# Patient Record
Sex: Female | Born: 1965 | Race: White | Hispanic: No | Smoking: Former smoker
Health system: Southern US, Community
[De-identification: ages and names within clinical notes are randomized; demographics above are authoritative.]

## PROBLEM LIST (undated history)

## (undated) DIAGNOSIS — M1990 Unspecified osteoarthritis, unspecified site: Secondary | ICD-10-CM

## (undated) DIAGNOSIS — M51369 Other intervertebral disc degeneration, lumbar region without mention of lumbar back pain or lower extremity pain: Secondary | ICD-10-CM

## (undated) DIAGNOSIS — G629 Polyneuropathy, unspecified: Secondary | ICD-10-CM

## (undated) DIAGNOSIS — F32A Depression, unspecified: Secondary | ICD-10-CM

## (undated) DIAGNOSIS — F419 Anxiety disorder, unspecified: Secondary | ICD-10-CM

## (undated) DIAGNOSIS — M5136 Other intervertebral disc degeneration, lumbar region: Secondary | ICD-10-CM

## (undated) DIAGNOSIS — K219 Gastro-esophageal reflux disease without esophagitis: Secondary | ICD-10-CM

## (undated) DIAGNOSIS — K2100 Gastro-esophageal reflux disease with esophagitis, without bleeding: Secondary | ICD-10-CM

## (undated) DIAGNOSIS — E785 Hyperlipidemia, unspecified: Secondary | ICD-10-CM

## (undated) DIAGNOSIS — T8859XA Other complications of anesthesia, initial encounter: Secondary | ICD-10-CM

## (undated) HISTORY — DX: Hyperlipidemia, unspecified: E78.5

## (undated) HISTORY — DX: Gastro-esophageal reflux disease with esophagitis, without bleeding: K21.00

## (undated) HISTORY — DX: Gastro-esophageal reflux disease with esophagitis: K21.0

## (undated) HISTORY — DX: Polyneuropathy, unspecified: G62.9

## (undated) HISTORY — PX: TRIGGER FINGER RELEASE: SHX641

## (undated) HISTORY — DX: Other intervertebral disc degeneration, lumbar region without mention of lumbar back pain or lower extremity pain: M51.369

## (undated) HISTORY — PX: CARPAL TUNNEL RELEASE: SHX101

## (undated) HISTORY — DX: Other intervertebral disc degeneration, lumbar region: M51.36

## (undated) HISTORY — PX: JOINT REPLACEMENT: SHX530

## (undated) HISTORY — PX: KNEE ARTHROSCOPY: SHX127

## (undated) HISTORY — DX: Gastro-esophageal reflux disease without esophagitis: K21.9

## (undated) HISTORY — DX: Unspecified osteoarthritis, unspecified site: M19.90

## (undated) HISTORY — PX: CHOLECYSTECTOMY: SHX55

---

## 1992-12-07 HISTORY — PX: OTHER SURGICAL HISTORY: SHX169

## 2000-07-28 ENCOUNTER — Encounter: Admission: RE | Admit: 2000-07-28 | Discharge: 2000-07-28 | Payer: Self-pay | Admitting: Family Medicine

## 2000-07-28 ENCOUNTER — Encounter: Payer: Self-pay | Admitting: Family Medicine

## 2006-11-01 ENCOUNTER — Other Ambulatory Visit: Admission: RE | Admit: 2006-11-01 | Discharge: 2006-11-01 | Payer: Self-pay | Admitting: Family Medicine

## 2006-12-10 ENCOUNTER — Encounter: Admission: RE | Admit: 2006-12-10 | Discharge: 2006-12-10 | Payer: Self-pay | Admitting: Family Medicine

## 2007-12-07 ENCOUNTER — Other Ambulatory Visit: Admission: RE | Admit: 2007-12-07 | Discharge: 2007-12-07 | Payer: Self-pay | Admitting: Family Medicine

## 2008-05-18 IMAGING — US MAMMO-RUNI-US
1 series · 6 of 6 positions shown · non-contrast
Comparison: NONE

CLINICAL DATA: c:  Breast Altaj        Oudai Sihamdi, 
RT(R)(M)    Diagnostic Mammogram. 

RIGHT BREAST MAMMOGRAM ADDITIONAL VIEWS AND RIGHT BREAST 
ULTRASOUND

[Series 1: us breast · 0.06mm/px · 6 of 6 slices shown]
[im 1/6]
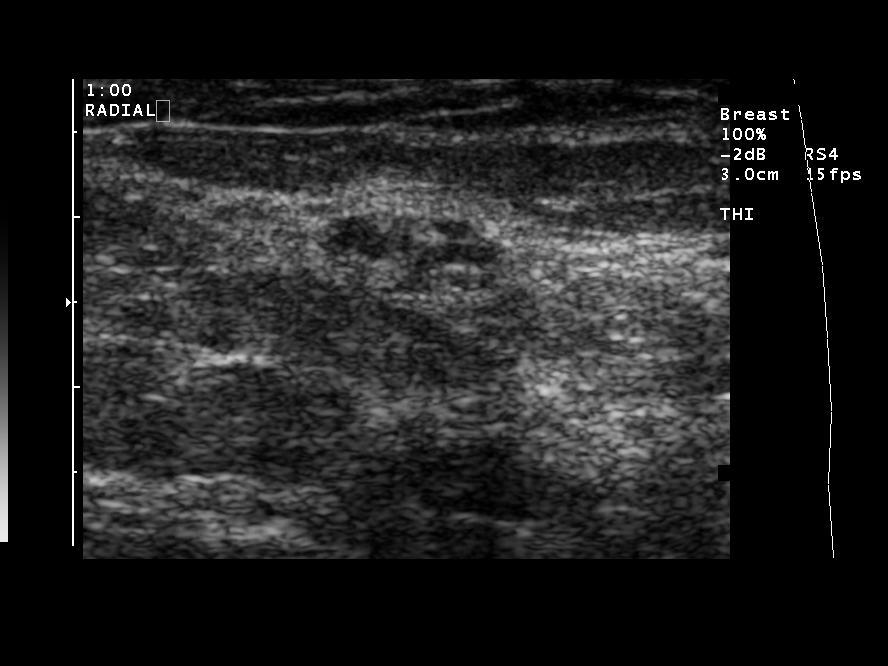
[im 2/6]
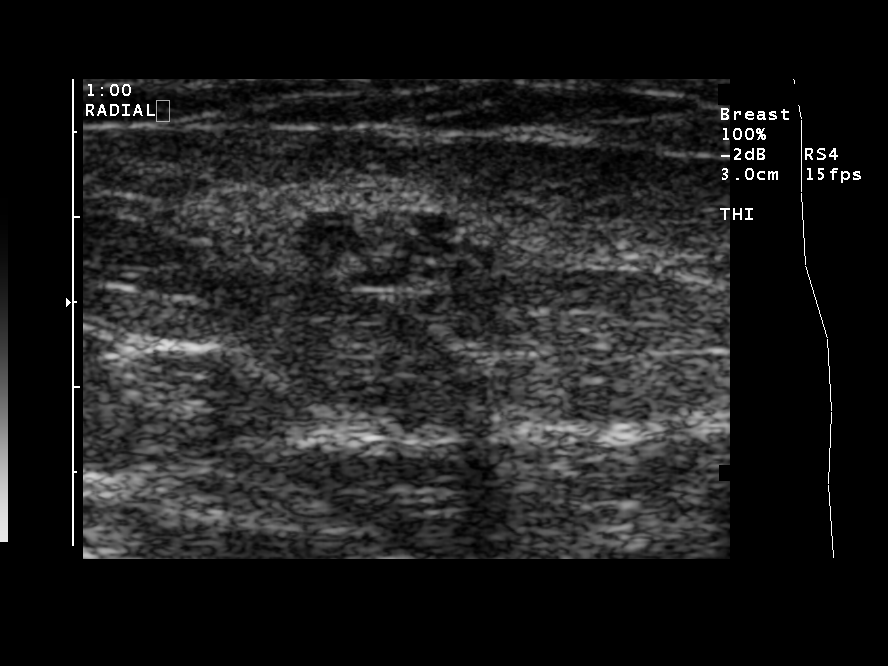
[im 3/6]
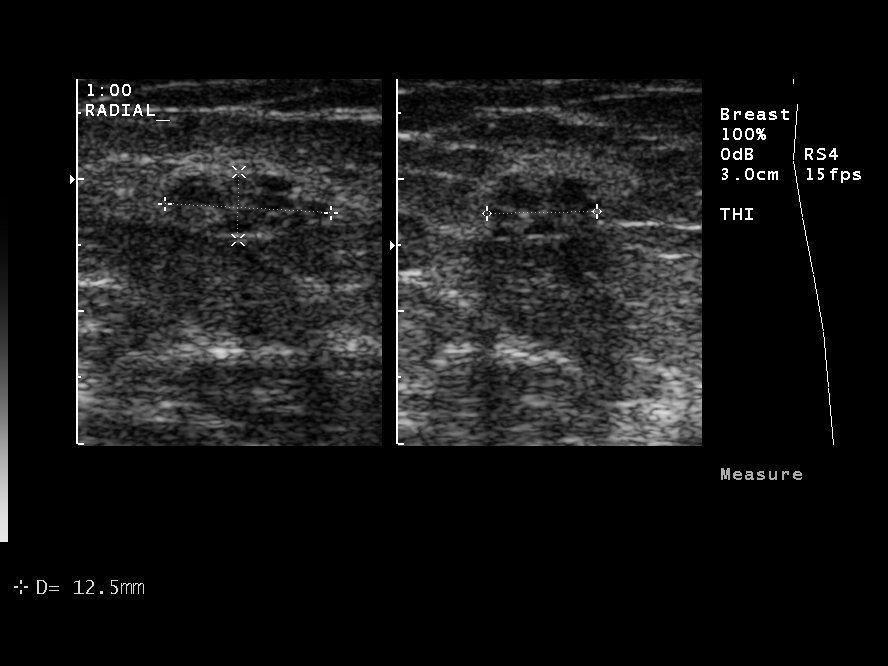
[im 4/6]
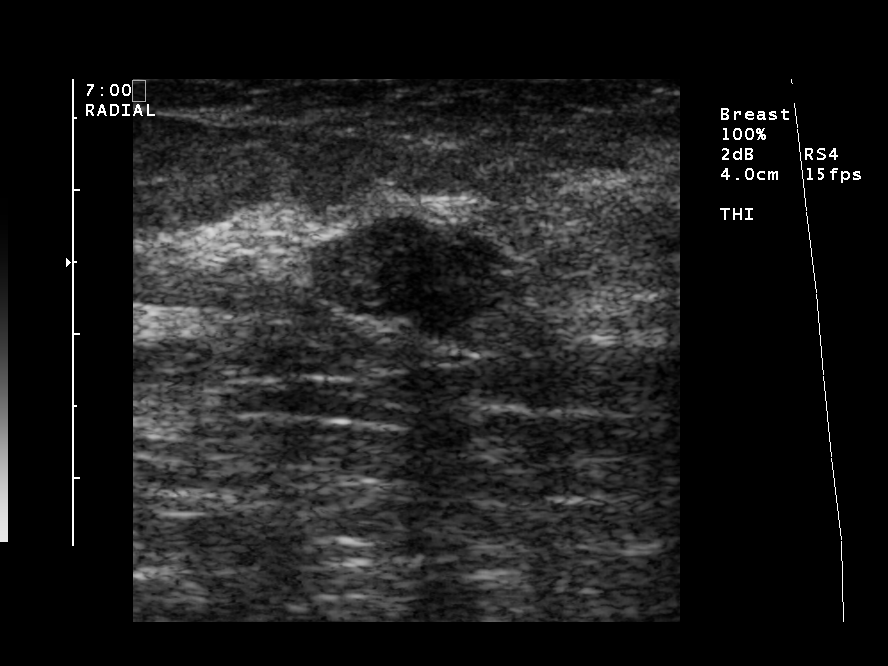
[im 5/6]
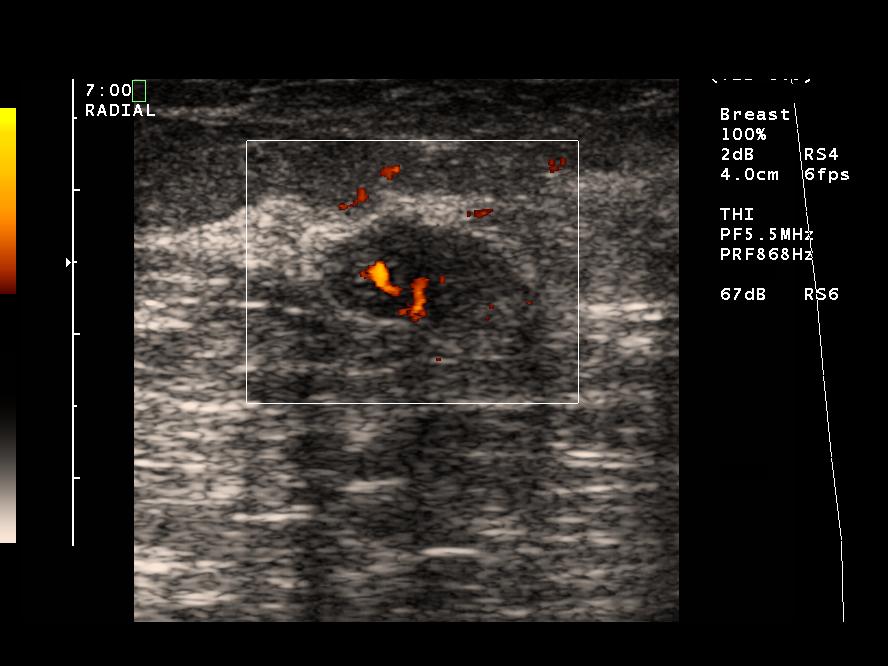
[im 6/6]
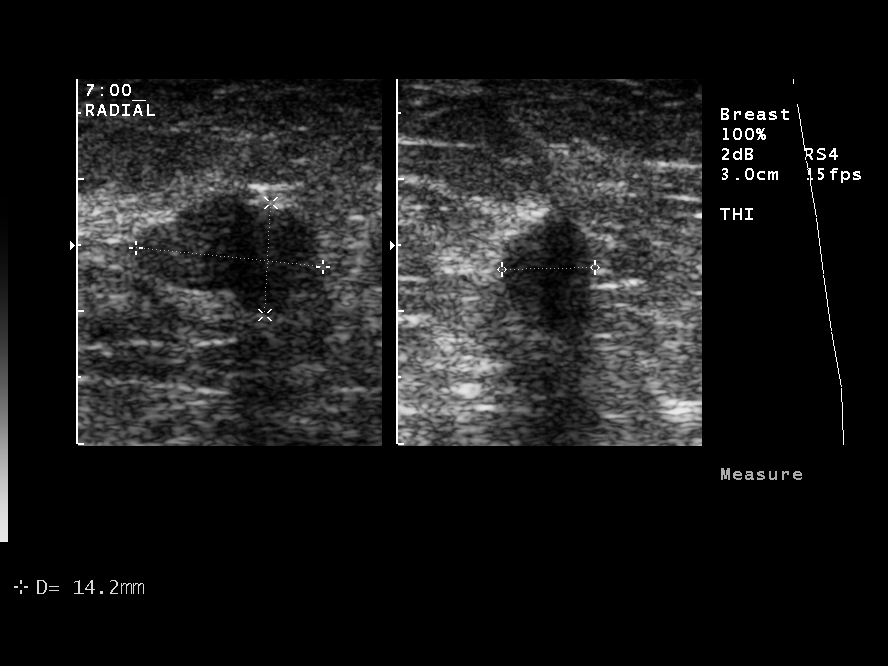

[6 of 6 positions shown; findings below may reference images not displayed]

FINDINGS: Mammogram demonstrates persistent nodular density in 
the right breast in the 6 to [DATE] position.  Smaller nodular 
density, sub-centimeter in size, is noted in the CC view. 
Ultrasound demonstrates an area of minimal microcystic change in 
the [DATE] position. In the [DATE] position, ultrasound demonstrates 
an irregular slightly lobular hypoechoic mass with shadowing which 
likely corresponds to the mammographic abnormality.  This measures 
14.2 x 8.4 x 7.0 mm.
IMPRESSION: Abnormal right breast mammogram and ultrasound with 
findings suspicious for malignancy in the [DATE] position best seen 
on the ultrasound examination.  Referral to the [REDACTED] for 
consideration of biopsy has been scheduled for 12-10-06 at [DATE] 
a.m. The patient was informed at the time of the examination of 
the findings and recommendations by verbal and written lay report. 
Computer assisted (Second Look) technology was used as an aid in 
interpretation of this study. BI-RADS 4:  Suspicious abnormality ??? 
electronically reviewed on 12/08/2006 Dict Date: 12/08/2006  Tran 
Date: 12/08/2006 ABEDRAZAK SA

## 2008-12-12 ENCOUNTER — Other Ambulatory Visit: Admission: RE | Admit: 2008-12-12 | Discharge: 2008-12-12 | Payer: Self-pay | Admitting: Family Medicine

## 2009-04-04 ENCOUNTER — Ambulatory Visit (HOSPITAL_BASED_OUTPATIENT_CLINIC_OR_DEPARTMENT_OTHER): Admission: RE | Admit: 2009-04-04 | Discharge: 2009-04-04 | Payer: Self-pay | Admitting: Orthopedic Surgery

## 2009-12-16 ENCOUNTER — Other Ambulatory Visit: Admission: RE | Admit: 2009-12-16 | Discharge: 2009-12-16 | Payer: Self-pay | Admitting: Family Medicine

## 2010-01-03 ENCOUNTER — Encounter: Admission: RE | Admit: 2010-01-03 | Discharge: 2010-01-03 | Payer: Self-pay | Admitting: Family Medicine

## 2010-12-28 ENCOUNTER — Encounter: Payer: Self-pay | Admitting: Family Medicine

## 2011-03-18 LAB — POCT HEMOGLOBIN-HEMACUE: Hemoglobin: 15 g/dL (ref 12.0–15.0)

## 2011-04-21 NOTE — Op Note (Signed)
NAME:  Robin King, Robin King               ACCOUNT NO.:  [id number]   MEDICAL RECORD NO.:  [id number]          PATIENT TYPE:  AMB   LOCATION:  DSC                          FACILITY:  [facility]   PHYSICIAN:  Katy Fitch. Sypher, M.D. DATE OF BIRTH:  13-Dec-1965   DATE OF PROCEDURE:  04/04/2009  DATE OF DISCHARGE:                               OPERATIVE REPORT   PREOPERATIVE DIAGNOSIS:  Entrapment neuropathy, median nerve, right  carpal tunnel.   POSTOPERATIVE DIAGNOSIS:  Entrapment neuropathy, median nerve, right  carpal tunnel.   OPERATION:  Release of right transcarpal ligament.   OPERATIONS:  Katy Fitch. Sypher, MD.   ASSISTANT:  Marveen Reeks Dasnoit, PA-C   ANESTHESIA:  General by LMA.   SUPERVISING ANESTHESIOLOGIST:  Germaine Pomfret, MD   INDICATIONS:  Kateleen Encarnacion is a 45 year old woman referred through the  courtesy of Dr. Catha Gosselin for evaluation of right hand numbness.  Clinical examination suggested carpal tunnel syndrome.  Electrodiagnostic confirmed severe carpal tunnel syndrome.   Due to a failed respond to nonoperative measures, she is brought to the  operating room at this time for release of her right transcarpal  ligament.   Preoperatively, she was advised of the potential risks and benefits of  surgery.  Questions were invited and answered detail.   PROCEDURE:  Kennedie Pardoe was brought to the operating room and placed  in the supine position on the operating table.   Following the induction of general anesthesia by LMA technique, the  right arm was prepped with Betadine soap solution and sterilely draped.  Following exsanguination of right arm with an Esmarch bandage, an  arterial tourniquet was inflated to 220 mmHg.  Procedure commenced with  short incision in line of the ring finger and palm.  Subcutaneous  tissues were carefully divided revealing palmar fascia.  This was split  longitudinally to reveal the common sensory branch of the median nerve.  These were  followed back to transcarpal ligaments, which was gently  isolated from median nerve with [facility].  The ligament was  released with scissors subcutaneously extending into the distal forearm.  This widely opened carpal canal.  No masses or other predicaments were  noted.  Bleeding points along the margin of the released ligament were  electrocauterized with bipolar current followed by repair of the skin  with intradermal 3-0 Prolene suture.   A compressive dressing was applied with a volar plaster splint  maintaining the wrist in 5 degrees of dorsiflexion.      Katy Fitch Sypher, M.D.  Electronically Signed     RVS/MEDQ  D:  04/04/2009  T:  04/04/2009  Job:  [id number]   cc:   Caryn Bee L. Little, M.D.

## 2017-09-09 DIAGNOSIS — G5603 Carpal tunnel syndrome, bilateral upper limbs: Secondary | ICD-10-CM | POA: Insufficient documentation

## 2017-11-19 ENCOUNTER — Encounter: Payer: Self-pay | Admitting: *Deleted

## 2017-11-22 ENCOUNTER — Encounter: Payer: Self-pay | Admitting: Diagnostic Neuroimaging

## 2017-11-22 ENCOUNTER — Ambulatory Visit: Payer: 59 | Admitting: Diagnostic Neuroimaging

## 2017-11-22 VITALS — BP 147/83 | HR 69 | Ht 66.0 in | Wt 200.8 lb

## 2017-11-22 DIAGNOSIS — R202 Paresthesia of skin: Secondary | ICD-10-CM | POA: Diagnosis not present

## 2017-11-22 DIAGNOSIS — R200 Anesthesia of skin: Secondary | ICD-10-CM

## 2017-11-22 DIAGNOSIS — M5416 Radiculopathy, lumbar region: Secondary | ICD-10-CM

## 2017-11-22 NOTE — Progress Notes (Signed)
[facility]  PATIENT: Robin King DOB: May 23, 1966  REFERRING CLINICIAN: Ramos HISTORY FROM: patient and chart review  REASON FOR VISIT: new consult    HISTORICAL  CHIEF COMPLAINT:  Chief Complaint  Patient presents with  . leg weakness, numbness    rm 7, New Pt, "leg weakness, pain, stinging, electrical shocks-getting worse; ears tingle when I look up or down"    HISTORY OF PRESENT ILLNESS:   51 year old female here for evaluation of lower extremity numbness, pain, electrical sensation.  Also with abnormal sensation in her ears when she moves her head up or down.  Patient reports pain in her hips, buttocks, legs starting about 4 years ago.  Typically this would occur in the summertime when she was more active outside.  Symptoms would be intermittent, worse with activity, less when she would rest.  Over the years this has increased.  This year symptoms have been particularly worse.  She has constant numbness and "dead feeling" in her right anterior and lateral thigh.  Patient went to orthopedic clinic, had evaluation and MRI of the cervical and lumbar spine.  Cervical spine showed multilevel degenerative changes, but more significant in the lumbar spine region.  This is particularly noted at L4-5 level and less so at L2-3 and L3-4 levels.  She was treated empirically with epidural steroid injections on 10/21/17 and 11/13/17.  Unfortunately symptoms have continued.  She is also tried gabapentin 300 g 3 times a day, but cannot tolerate higher doses due to "stuttering".  She is also been started on Cymbalta recently without benefit.  Patient also reports another symptom which is not related to her low back which is an abnormal sensation in her deep inner ears when she moves her head up or down.  She feels like it is a numbness tingling sensation.  Patient has been trying to be more healthy lately.  She quit smoking in March 2018.  She is lost over 40 pounds over the  last 1-2 years by cutting out sugar and bread.  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  She tries to stay active at other times by doing yard work, swimming and other outdoor activities.   REVIEW OF SYSTEMS: Full 14 system review of systems performed and negative with exception of: Numbness weakness slurred speech depression.  ALLERGIES: No Known Allergies  HOME MEDICATIONS: Outpatient Medications Prior to Visit  Medication Sig Dispense Refill  . amphetamine-dextroamphetamine (ADDERALL) 10 MG tablet TAKE 1 TABLET BY MOUTH THREE TIMES A DAY    . buPROPion (WELLBUTRIN XL) 300 MG 24 hr tablet bupropion HCl XL 300 mg 24 hr tablet, extended release    . cetirizine (ZYRTEC) 10 MG chewable tablet Chew 10 mg by mouth daily.    . DULoxetine (CYMBALTA) 20 MG capsule Take 60 mg by mouth daily.    Marland Kitchen. gabapentin (NEURONTIN) 100 MG capsule 300 mg. 1-2 as needed    . meloxicam (MOBIC) 15 MG tablet as needed.    . methocarbamol (ROBAXIN) 750 MG tablet Take 750 mg by mouth every 8 (eight) hours as needed for muscle spasms.    Marland Kitchen. omeprazole (PRILOSEC) 40 MG capsule omeprazole 40 mg capsule,delayed release    . valACYclovir (VALTREX) 500 MG tablet Take 500 mg by mouth daily.     No facility-administered medications prior to visit.     PAST MEDICAL HISTORY: Past Medical History:  Diagnosis Date  . Arthritis   .  DDD (degenerative disc disease), lumbar   . Peripheral neuropathy   . Reflux esophagitis     PAST SURGICAL HISTORY: Past Surgical History:  Procedure Laterality Date  . CARPAL TUNNEL RELEASE Right 2010, 09/2017   both  . CESAREAN SECTION  1993, 1996   x 2  . gall bladder  1994   lap choley  . KNEE ARTHROSCOPY Right 1985, 1987    FAMILY HISTORY: Family History  Problem Relation Age of Onset  . Depression Mother   . Neuropathy Mother   . COPD Father   . Osteoarthritis Father   . Neuropathy Father   . Melanoma  Sister   . Colon cancer Paternal Grandmother     SOCIAL HISTORY:  Social History   Socioeconomic History  . Marital status: Divorced    Spouse name: Not on file  . Number of children: 2  . Years of education: 5416  . Highest education level: Not on file  Social Needs  . Financial resource strain: Not on file  . Food insecurity - worry: Not on file  . Food insecurity - inability: Not on file  . Transportation needs - medical: Not on file  . Transportation needs - non-medical: Not on file  Occupational History    Comment: phlebotomist [employer]  Tobacco Use  . Smoking status: Former Smoker    Packs/day: 3.00    Types: Cigarettes    Last attempt to quit: 09/22/2017    Years since quitting: 0.1  . Smokeless tobacco: Never Used  Substance and Sexual Activity  . Alcohol use: Yes    Comment: 4-6 beers weekly  . Drug use: No  . Sexual activity: Not on file  Other Topics Concern  . Not on file  Social History Narrative   Lives with son   Caffeine- 3 daily, black tea, sodas     PHYSICAL EXAM  GENERAL EXAM/CONSTITUTIONAL: Vitals:  Vitals:   11/22/17 1511  BP: (!) 147/83  Pulse: 69     There is no height or weight on file to calculate BMI.  Visual Acuity Screening   Right eye Left eye Both eyes  Without correction: 20/50 20/50   With correction:        Patient is in no distress; well developed, nourished and groomed; neck is supple  CARDIOVASCULAR:  Examination of carotid arteries is normal; no carotid bruits  Regular rate and rhythm, no murmurs  Examination of peripheral vascular system by observation and palpation is normal  EYES:  Ophthalmoscopic exam of optic discs and posterior segments is normal; no papilledema or hemorrhages  MUSCULOSKELETAL:  Gait, strength, tone, movements noted in Neurologic exam below  NEUROLOGIC: MENTAL STATUS:  No flowsheet data found.  awake, alert, oriented to person, place and time  recent and remote memory  intact  normal attention and concentration  language fluent, comprehension intact, naming intact,   fund of knowledge appropriate  CRANIAL NERVE:   2nd - no papilledema on fundoscopic exam  2nd, 3rd, 4th, 6th - pupils equal and reactive to light, visual fields full to confrontation, extraocular muscles intact, no nystagmus  5th - facial sensation symmetric  7th - facial strength symmetric  8th - hearing intact  9th - palate elevates symmetrically, uvula midline  11th - shoulder shrug symmetric  12th - tongue protrusion midline  MOTOR:   normal bulk and tone, full strength in the BUE, BLE  SENSORY:   normal and symmetric to light touch, temperature, vibration  COORDINATION:   finger-nose-finger,  fine finger movements normal  REFLEXES:   deep tendon reflexes present and symmetric  GAIT/STATION:   narrow based gait; able to walk on toes, heels and tandem; romberg is negative    DIAGNOSTIC DATA (LABS, IMAGING, TESTING) - I reviewed patient records, labs, notes, testing and imaging myself where available.  Lab Results  Component Value Date   HGB 15.0 04/04/2009   No results found for: NA, K, CL, CO2, GLUCOSE, BUN, CREATININE, CALCIUM, PROT, ALBUMIN, AST, ALT, ALKPHOS, BILITOT, GFRNONAA, GFRAA No results found for: CHOL, HDL, LDLCALC, LDLDIRECT, TRIG, CHOLHDL No results found for: WUJW1X No results found for: VITAMINB12 No results found for: TSH   09/27/17 MRI cervical [I reviewed images myself and agree with interpretation. -VRP]  -At C6-7 severe right and moderate left foraminal narrowing; mild to moderate central stenosis -At C5-6 moderate to severe bilateral foraminal narrowing and mild central canal stenosis -At C4-5 mild bilateral foraminal narrowing -At C3-4 mild right foraminal narrowing  09/27/17 MRI lumbar [I reviewed images myself and agree with interpretation. -VRP]  -At L4-5 severe left foraminal narrowing -At L2-3 moderate right foraminal  narrowing -At L3-4 mild right neuroforaminal narrowing; mild spinal canal stenosis     ASSESSMENT AND PLAN  51 y.o. year old female here with abnormal lower extremity numbness, pain, tingling, likely related to lumbar degenerative spine disease and lumbar radiculopathies.  Also with right meralgia paresthetica.  Agree with conservative management of symptoms with anti-inflammatory medications, pain medications, injection therapy, physical therapy, optimize nutrition and exercise.  Regarding her symptoms and her ears and head movement, this is less clear.  I will check MRI of the brain to rule out other causes.   Dx:  1. Lumbar radiculopathy   2. Lower extremity numbness      PLAN:  LOWER EXT NUMBNESS / PAIN (due to lumbar degenerative spine disease and radiculopathy) - continue conservative mgmt (gabapentin, cymbalta, epidural steroid injection, physical therapy) per Dr. Ethelene Hal - optimize nutrition and physical therapy  EAR TINGLING SENSATION - check MRI brain  Orders Placed This Encounter  Procedures  . MR BRAIN WO CONTRAST   Return if symptoms worsen or fail to improve, for return to PCP. pending MRI brain and symptom evolution.    Suanne Marker, MD 11/22/2017, 3:40 PM Certified in Neurology, Neurophysiology and Neuroimaging  [facility] [address] [place], [place] [address] [phone]

## 2017-11-22 NOTE — Patient Instructions (Addendum)
Thank you for coming to see [place] at [facility]. I hope we have been able to provide you high quality care today.  You may receive a patient satisfaction survey over the next few weeks. We would appreciate your feedback and comments so that we may continue to improve ourselves and the health of our patients.  - continue conservative mgmt (gabapentin, cymbalta, epidural steroid injection, physical therapy)  - optimize nutrition and physical therapy   ~~~~~~~~~~~~~~~~~~~~~~~~~~~~~~~~~~~~~~~~~~~~~~~~~~~~~~~~~~~~~~~~~  DR. Bary Limbach'S GUIDE TO HAPPY AND HEALTHY LIVING These are some of my general health and wellness recommendations. Some of them may apply to you better than others. Please use common sense as you try these suggestions and feel free to ask me any questions.   ACTIVITY/FITNESS Mental, social, emotional and physical stimulation are very important for brain and body health. Try learning a new activity (arts, music, language, sports, games).  Keep moving your body to the best of your abilities. You can do this at home, inside or outside, the park, community center, gym or anywhere you like. Consider a physical therapist or personal trainer to get started. Consider the app Sworkit. Fitness trackers such as smart-watches, smart-phones or Fitbits can help as well.   NUTRITION Eat more plants: colorful vegetables, nuts, seeds and berries.  Eat less sugar, salt, preservatives and processed foods.  Avoid toxins such as cigarettes and alcohol.  Drink water when you are thirsty. Warm water with a slice of lemon is an excellent morning drink to start the day.  Consider these websites for more information The Nutrition Source (https://www.henry-hernandez.biz/) Precision Nutrition (WindowBlog.ch)   RELAXATION Consider practicing mindfulness meditation or other relaxation techniques such as deep breathing, prayer, yoga, tai  chi, massage. See website mindful.org or the apps Headspace or Calm to help get started.   SLEEP Try to get at least 7-8+ hours sleep per day. Regular exercise and reduced caffeine will help you sleep better. Practice good sleep hygeine techniques. See website sleep.org for more information.   PLANNING Prepare estate planning, living will, healthcare POA documents. Sometimes this is best planned with the help of an attorney. Theconversationproject.org and agingwithdignity.org are excellent resources.

## 2017-12-04 ENCOUNTER — Ambulatory Visit
Admission: RE | Admit: 2017-12-04 | Discharge: 2017-12-04 | Disposition: A | Discharge status: Home / Self Care | Payer: 59 | Source: Ambulatory Visit | Attending: Diagnostic Neuroimaging | Admitting: Diagnostic Neuroimaging

## 2017-12-04 DIAGNOSIS — R202 Paresthesia of skin: Principal | ICD-10-CM

## 2017-12-04 DIAGNOSIS — R200 Anesthesia of skin: Secondary | ICD-10-CM

## 2017-12-08 ENCOUNTER — Telehealth: Payer: Self-pay | Admitting: *Deleted

## 2017-12-08 NOTE — Telephone Encounter (Signed)
Spoke with patient and informed her that her MRI brain was normal. She asked how to get a copy; advised she needs to sign a release form. She stated she lives one hour away., this RN gave her number to call to sign up for My Chart where she can access report. Patient verbalized understanding,m appreciation.

## 2017-12-18 DIAGNOSIS — G8929 Other chronic pain: Secondary | ICD-10-CM | POA: Insufficient documentation

## 2018-03-08 DIAGNOSIS — M1120 Other chondrocalcinosis, unspecified site: Secondary | ICD-10-CM | POA: Insufficient documentation

## 2019-10-01 ENCOUNTER — Ambulatory Visit
Admission: EM | Admit: 2019-10-01 | Discharge: 2019-10-01 | Disposition: A | Discharge status: Home / Self Care | Payer: Managed Care, Other (non HMO)

## 2019-10-01 ENCOUNTER — Other Ambulatory Visit: Payer: Self-pay

## 2019-10-01 DIAGNOSIS — R059 Cough, unspecified: Secondary | ICD-10-CM

## 2019-10-01 DIAGNOSIS — R05 Cough: Secondary | ICD-10-CM

## 2019-10-01 DIAGNOSIS — Z1159 Encounter for screening for other viral diseases: Secondary | ICD-10-CM | POA: Diagnosis not present

## 2019-10-01 DIAGNOSIS — R110 Nausea: Secondary | ICD-10-CM | POA: Diagnosis not present

## 2019-10-01 MED ORDER — ONDANSETRON 4 MG PO TBDP: 4.0000 mg | ORAL_TABLET | Freq: Three times a day (TID) | ORAL | 0 refills | Status: DC | PRN

## 2019-10-01 MED ORDER — BENZONATATE 100 MG PO CAPS: 100.0000 mg | ORAL_CAPSULE | Freq: Three times a day (TID) | ORAL | 0 refills | Status: DC

## 2019-10-01 NOTE — ED Provider Notes (Signed)
[facility]    CSN: [id number] Arrival date & time: 10/01/19  1334      History   Chief Complaint Chief Complaint  Patient presents with  . Cough    HPI Robin King is a 53 y.o. female presenting for 5-day course of productive, nonwarm to cough, bilateral ear fullness, with less than 24-hour onset of chills, generalized headache, nausea.  Patient has tried leftover benzonatate with adequate relief of cough as well as Mucinex for nasal congestion.  Patient is not taking anything for her headache.  Patient is able to tolerate p.o. intake.  Denies known Covid exposure.   Past Medical History:  Diagnosis Date  . Arthritis   . DDD (degenerative disc disease), lumbar   . Peripheral neuropathy   . Reflux esophagitis     There are no active problems to display for this patient.   Past Surgical History:  Procedure Laterality Date  . CARPAL TUNNEL RELEASE Right 2010, 09/2017   both  . [place]   x 2  . gall bladder  1994   lap choley  . KNEE ARTHROSCOPY Right 1985, 1987    OB History   No obstetric history on file.      Home Medications    Prior to Admission medications   Medication Sig Start Date End Date Taking? Authorizing Provider  celecoxib (CELEBREX) 200 MG capsule Take 200 mg by mouth 2 (two) times daily.   Yes [provider]  amphetamine-dextroamphetamine (ADDERALL) 10 MG tablet TAKE 1 TABLET BY MOUTH THREE TIMES A DAY 09/02/17   [provider]  benzonatate (TESSALON) 100 MG capsule Take 1 capsule (100 mg total) by mouth every 8 (eight) hours. 10/01/19   Hall-Potvin, [place], PA-C  buPROPion (WELLBUTRIN XL) 300 MG 24 hr tablet bupropion HCl XL 300 mg 24 hr tablet, extended release 06/30/17   [provider]  cetirizine (ZYRTEC) 10 MG chewable tablet Chew 10 mg by mouth daily.    [provider]  DULoxetine (CYMBALTA) 20 MG capsule Take 60 mg by mouth daily.    [provider]   gabapentin (NEURONTIN) 100 MG capsule 300 mg. 1-2 as needed    [provider]  meloxicam (MOBIC) 15 MG tablet as needed. 08/22/17   [provider]  methocarbamol (ROBAXIN) 750 MG tablet Take 750 mg by mouth every 8 (eight) hours as needed for muscle spasms.    [provider]  omeprazole (PRILOSEC) 40 MG capsule omeprazole 40 mg capsule,delayed release 09/05/17   [provider]  ondansetron (ZOFRAN ODT) 4 MG disintegrating tablet Take 1 tablet (4 mg total) by mouth every 8 (eight) hours as needed for nausea or vomiting. 10/01/19   Hall-Potvin, [place], PA-C  valACYclovir (VALTREX) 500 MG tablet Take 500 mg by mouth daily.    [provider]    Family History Family History  Problem Relation Age of Onset  . Depression Mother   . Neuropathy Mother   . COPD Father   . Osteoarthritis Father   . Neuropathy Father   . Melanoma Sister   . Colon cancer Paternal Grandmother     Social History Social History   Tobacco Use  . Smoking status: Former Smoker    Packs/day: 3.00    Types: Cigarettes    Quit date: 09/22/2017    Years since quitting: 2.0  . Smokeless tobacco: Never Used  Substance Use Topics  . Alcohol use: Yes    Comment: 4-6 beers  weekly  . Drug use: No     Allergies   Patient has no known allergies.   Review of Systems Review of Systems  Constitutional: Positive for appetite change and chills. Negative for activity change, fatigue and fever.  HENT: Positive for ear pain. Negative for congestion, dental problem, facial swelling, hearing loss, sinus pain, sore throat, trouble swallowing and voice change.   Eyes: Negative for photophobia, pain and visual disturbance.  Respiratory: Positive for cough. Negative for chest tightness, shortness of breath and wheezing.   Cardiovascular: Negative for chest pain and palpitations.  Gastrointestinal: Positive for nausea. Negative for abdominal pain, blood in stool, diarrhea and  vomiting.  Musculoskeletal: Negative for arthralgias and myalgias.  Neurological: Negative for dizziness and headaches.     Physical Exam Triage Vital Signs ED Triage Vitals  Enc Vitals Group     BP 10/01/19 1357 (!) 158/93     Pulse Rate 10/01/19 1357 87     Resp --      Temp 10/01/19 1357 98.6 F (37 C)     Temp Source 10/01/19 1357 Oral     SpO2 10/01/19 1357 97 %     Weight 10/01/19 1354 224 lb (101.6 kg)     Height --      Head Circumference --      Peak Flow --      Pain Score 10/01/19 1353 5     Pain Loc --      Pain Edu? --      Excl. in GC? --    No data found.  Updated Vital Signs BP (!) 158/93 (BP Location: Left Arm)   Pulse 87   Temp 98.6 F (37 C) (Oral)   Wt 224 lb (101.6 kg)   SpO2 97%   BMI 36.15 kg/m   Visual Acuity Right Eye Distance:   Left Eye Distance:   Bilateral Distance:    Right Eye Near:   Left Eye Near:    Bilateral Near:     Physical Exam Constitutional:      General: She is not in acute distress.    Appearance: She is not toxic-appearing.  HENT:     Head: Normocephalic and atraumatic.     Jaw: There is normal jaw occlusion. No tenderness or pain on movement.     Right Ear: Hearing, tympanic membrane, ear canal and external ear normal. No tenderness. No mastoid tenderness.     Left Ear: Hearing, tympanic membrane, ear canal and external ear normal. No tenderness. No mastoid tenderness.     Nose: Nose normal. No nasal deformity, septal deviation or nasal tenderness.     Right Turbinates: Not swollen or pale.     Left Turbinates: Not swollen or pale.     Right Sinus: No maxillary sinus tenderness or frontal sinus tenderness.     Left Sinus: No maxillary sinus tenderness or frontal sinus tenderness.     Mouth/Throat:     Lips: Pink. No lesions.     Mouth: Mucous membranes are moist. No injury.     Pharynx: Oropharynx is clear. Uvula midline. No posterior oropharyngeal erythema or uvula swelling.     Comments: no tonsillar  exudate or hypertrophy Eyes:     General: No scleral icterus.    Conjunctiva/sclera: Conjunctivae normal.     Pupils: Pupils are equal, round, and reactive to light.  Neck:     Musculoskeletal: Normal range of motion and neck supple. No muscular tenderness.  Cardiovascular:  Rate and Rhythm: Normal rate and regular rhythm.     Heart sounds: No murmur.  Pulmonary:     Effort: Pulmonary effort is normal. No respiratory distress.     Breath sounds: No wheezing.  Abdominal:     General: Abdomen is flat. Bowel sounds are normal.     Tenderness: There is no abdominal tenderness.  Lymphadenopathy:     Cervical: No cervical adenopathy.  Neurological:     Mental Status: She is alert and oriented to person, place, and time.      UC Treatments / Results  Labs (all labs ordered are listed, but only abnormal results are displayed) Labs Reviewed  NOVEL CORONAVIRUS, NAA    EKG   Radiology No results found.  Procedures Procedures (including critical care time)  Medications Ordered in UC Medications - No data to display  Initial Impression / Assessment and Plan / UC Course  I have reviewed the triage vital signs and the nursing notes.  Pertinent labs & imaging results that were available during my care of the patient were reviewed by me and considered in my medical decision making (see chart for details).     Patient afebrile, nontoxic in office today.  Patient does report ability to tolerate p.o. intake, though does admit to discomfort second to nausea.  Will trial Zofran for additional relief so that patient may maintain adequate p.o. hydration/nutrition.  Covid testing obtained in office given global pandemic, line of work-patient tolerated well and will quarantine until results are back.  Patient had run out of her Jerilynn Somessalon Perles (had only taken 4 doses total): Refill provided.  Patient denies shortness of breath: Albuterol inhaler deferred.  Will monitor symptoms closely and  consider reevaluation (VV vs in-person +/- CXR)  if testing is negative, cough is persisting/worsening or she develops fever.  Return precautions discussed, patient verbalized understanding and is agreeable to plan. Final Clinical Impressions(s) / UC Diagnoses   Final diagnoses:  Cough  Nausea without vomiting     Discharge Instructions     Your COVID test is pending: Is important you quarantine at home until your results are back. You may take OTC Tylenol for fever and myalgias.  It is important to drink plenty of water throughout the day to stay hydrated. If you test positive and later develop severe fever, cough, or shortness of breath, it is recommended that you go to the ER for further evaluation.    ED Prescriptions    Medication Sig Dispense Auth. Provider   ondansetron (ZOFRAN ODT) 4 MG disintegrating tablet Take 1 tablet (4 mg total) by mouth every 8 (eight) hours as needed for nausea or vomiting. 21 tablet Hall-Potvin, [place], PA-C   benzonatate (TESSALON) 100 MG capsule Take 1 capsule (100 mg total) by mouth every 8 (eight) hours. 21 capsule Hall-Potvin, [place], PA-C     PDMP not reviewed this encounter.   Hall-Potvin, [place], [place]C 10/01/19 1815

## 2019-10-01 NOTE — Discharge Instructions (Signed)
Your COVID test is pending: Is important you quarantine at home until your results are back. °You may take OTC Tylenol for fever and myalgias.  It is important to drink plenty of water throughout the day to stay hydrated. °If you test positive and later develop severe fever, cough, or shortness of breath, it is recommended that you go to the ER for further evaluation. °

## 2019-10-01 NOTE — ED Notes (Signed)
Patient able to ambulate independently  

## 2019-10-01 NOTE — ED Triage Notes (Signed)
Pt. States she has had a severe cough with green mucus since wednesday. Earache, body chills, pressure in ears since friday.

## 2019-10-02 LAB — NOVEL CORONAVIRUS, NAA: SARS-CoV-2, NAA: NOT DETECTED

## 2019-10-05 ENCOUNTER — Telehealth: Payer: Managed Care, Other (non HMO) | Admitting: Physician Assistant

## 2019-10-05 DIAGNOSIS — Z20822 Contact with and (suspected) exposure to covid-19: Secondary | ICD-10-CM

## 2019-10-05 DIAGNOSIS — R0989 Other specified symptoms and signs involving the circulatory and respiratory systems: Secondary | ICD-10-CM

## 2019-10-05 NOTE — Progress Notes (Signed)
Based on what you shared with me, I feel your condition warrants further evaluation and I recommend that you be seen for a face to face office visit.  Based on what you were telling me including previous evaluation with some concerning lung sounds and no improvement/worsening of symptoms, you need reassessment in person. This is to reassess your oxygenation status, reassess lung exam with potential imaging. covid does cause viral pneumonia plus you can develop secondary bacterial bronchitis and pneumonia while fighting viral infections.   If you are having a true medical emergency please call 911.     For an urgent face to face visit, [facility] has four urgent care centers for your convenience:   . [facility]    [phone]                  Get Driving Directions  [address], [place] [address] . 10 am to 8 pm Monday-Friday . 12 pm to 8 pm Saturday-Sunday   . [facility] [place]                  Get Driving Directions  [address], [place], [place] [address] . 8 am to 8 pm Monday-Friday . 9 am to 6 pm Saturday . 11 am to 6 pm Sunday   . [facility] [place]                  Get Driving Directions   [address] [place], [place] [address] . 8 am to 8 pm Monday-Friday . 8 am to 4 pm Saturday-Sunday    . [facility] [place]                    Get Driving Directions  [phone]  [address], [place], [place] [address]  . Monday-Friday, 12 PM to 6 PM    Your e-visit answers were reviewed by a board certified advanced clinical practitioner to complete your personal care plan.  Thank you for using e-Visits.

## 2019-10-12 ENCOUNTER — Ambulatory Visit (INDEPENDENT_AMBULATORY_CARE_PROVIDER_SITE_OTHER): Payer: Managed Care, Other (non HMO) | Admitting: Family Medicine

## 2019-10-12 ENCOUNTER — Other Ambulatory Visit: Payer: Self-pay

## 2019-10-12 ENCOUNTER — Encounter: Payer: Self-pay | Admitting: Family Medicine

## 2019-10-12 DIAGNOSIS — Z23 Encounter for immunization: Secondary | ICD-10-CM | POA: Diagnosis not present

## 2019-10-12 DIAGNOSIS — J218 Acute bronchiolitis due to other specified organisms: Secondary | ICD-10-CM | POA: Diagnosis not present

## 2019-10-12 MED ORDER — AZITHROMYCIN 250 MG PO TABS: 250.0000 mg | ORAL_TABLET | Freq: Every day | ORAL | 0 refills | Status: AC

## 2019-10-12 NOTE — Progress Notes (Signed)
   Subjective:    Patient ID: Robin King is a 53 y.o. female presenting with No chief complaint on file.  on 10/12/2019  HPI: 2 wk h/o cough. No fever/chills. Has tried tessalon, albuterol. Notes visit to Urgent care with negative COVID test 2 wks ago. She has not a flu shot.  Review of Systems  Constitutional: Negative for chills and fever.  Respiratory: Negative for shortness of breath.   Cardiovascular: Negative for chest pain.  Gastrointestinal: Negative for abdominal pain, nausea and vomiting.  Genitourinary: Negative for dysuria.  Skin: Negative for rash.      Objective:    There were no vitals taken for this visit. Physical Exam Constitutional:      General: She is not in acute distress.    Appearance: She is well-developed.  HENT:     Head: Normocephalic and atraumatic.  Eyes:     General: No scleral icterus. Neck:     Musculoskeletal: Neck supple.  Cardiovascular:     Rate and Rhythm: Normal rate.  Pulmonary:     Effort: Pulmonary effort is normal.     Breath sounds: Normal breath sounds. No wheezing, rhonchi or rales.  Abdominal:     Palpations: Abdomen is soft.  Skin:    General: Skin is warm and dry.  Neurological:     Mental Status: She is alert and oriented to person, place, and time.         Assessment & Plan:   Problem List Items Addressed This Visit    None    Visit Diagnoses    Acute bronchiolitis due to other specified organisms    -  Primary   Given no improvement x 2 wks, will treat presumptively.   Relevant Medications   azithromycin (ZITHROMAX) 250 MG tablet      Total face-to-face time with patient: 15 minutes. Over 50% of encounter was spent on counseling and coordination of care. Return if symptoms worsen or fail to improve.  Donnamae Jude 10/12/2019 3:09 PM

## 2019-10-15 ENCOUNTER — Encounter: Payer: Self-pay | Admitting: Emergency Medicine

## 2019-10-15 ENCOUNTER — Ambulatory Visit (INDEPENDENT_AMBULATORY_CARE_PROVIDER_SITE_OTHER): Payer: Managed Care, Other (non HMO)

## 2019-10-15 ENCOUNTER — Ambulatory Visit
Admission: EM | Admit: 2019-10-15 | Discharge: 2019-10-15 | Disposition: A | Discharge status: Home / Self Care | Payer: Managed Care, Other (non HMO) | Attending: Emergency Medicine | Admitting: Emergency Medicine

## 2019-10-15 ENCOUNTER — Other Ambulatory Visit: Payer: Self-pay

## 2019-10-15 DIAGNOSIS — Z20828 Contact with and (suspected) exposure to other viral communicable diseases: Secondary | ICD-10-CM | POA: Diagnosis not present

## 2019-10-15 DIAGNOSIS — J45998 Other asthma: Secondary | ICD-10-CM | POA: Diagnosis not present

## 2019-10-15 DIAGNOSIS — R05 Cough: Secondary | ICD-10-CM

## 2019-10-15 DIAGNOSIS — R059 Cough, unspecified: Secondary | ICD-10-CM

## 2019-10-15 MED ORDER — PREDNISONE 20 MG PO TABS: 40.0000 mg | ORAL_TABLET | Freq: Every day | ORAL | 0 refills | Status: AC

## 2019-10-15 NOTE — Discharge Instructions (Signed)
Take steroid as prescribed. May continue albuterol as needed. May take Mucinex DM for additional relief.  Your COVID test is pending - it is important to quarantine / isolate at home until your results are back. If you test positive and would like further evaluation for persistent or worsening symptoms, you may schedule an E-visit or virtual (video) visit throughout the [facility] app or website.  PLEASE NOTE: If you develop severe chest pain or shortness of breath please go to the ER or call 9-1-1 for further evaluation --> DO NOT schedule electronic or virtual visits for this. Please call our office for further guidance / recommendations as needed.

## 2019-10-15 NOTE — ED Triage Notes (Addendum)
PT has had cough for 20 days. Was seen for this 2 weeks ago. PT was COVID negative at that visit.   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   PT has been incontinent with cough, it keeps her up at night. She works in an [employer] office and is requesting COVID testing again.  Requests chest xray as well.   Also has headache

## 2019-10-15 NOTE — ED Provider Notes (Addendum)
[facility]    CSN: [id number] Arrival date & time: 10/15/19  1150      History   Chief Complaint Chief Complaint  Patient presents with  . Cough    HPI Robin King is a 53 y.o. female presenting for persistent cough.  Patient seen initially in urgent care setting on 09/30/2024 me: Given Zofran for nausea, Tessalon Perles for cough.  Patient denied nausea now, had some relief with benzonatate.  Overall, patient feels symptoms have been persistent/stable.  Since last visit in UC, was evaluated by her PCP via E-visit who recommended in person evaluation.  Patient did not want to risk Covid exposure by coming to UC, so her OB/GYN wrote for Z-Pak for acute bronchiolitis.  Patient is on day 4 and reports no improvement of symptoms.  Patient feels cough is productive, without hemoptysis, in the morning and evening: Denies nasal congestion, sinus pressure, postnasal drip.  Patient denies history of heart disease, cardiomyopathy, heart failure.  No upper or lower extremity edema.   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  Covid test from 10/01/2019 was negative.  Patient largely concerned about being contagious as she is to take care of her grandson who is 108 months old next weekend.    Past Medical History:  Diagnosis Date  . Arthritis   . DDD (degenerative disc disease), lumbar   . Peripheral neuropathy   . Reflux esophagitis     There are no active problems to display for this patient.   Past Surgical History:  Procedure Laterality Date  . CARPAL TUNNEL RELEASE Right 2010, 09/2017   both  . CESAREAN SECTION  1993, 1996   x 2  . gall bladder  1994   lap choley  . KNEE ARTHROSCOPY Right 1985, 1987    OB History   No obstetric history on file.      Home Medications    Prior to Admission medications   Medication Sig Start Date End Date Taking? Authorizing Provider  amphetamine-dextroamphetamine  (ADDERALL) 10 MG tablet TAKE 1 TABLET BY MOUTH THREE TIMES A DAY 09/02/17  Yes [provider]  azithromycin (ZITHROMAX) 250 MG tablet Take 1 tablet (250 mg total) by mouth daily for 5 days. 2 po day 1 then 1 po q day x 4 10/12/19 10/17/19 Yes Reva Bores, MD  benzonatate (TESSALON) 100 MG capsule Take 1 capsule (100 mg total) by mouth every 8 (eight) hours. 10/01/19  Yes Hall-Potvin, [place], PA-C  buPROPion (WELLBUTRIN XL) 300 MG 24 hr tablet bupropion HCl XL 300 mg 24 hr tablet, extended release 06/30/17  Yes [provider]  celecoxib (CELEBREX) 200 MG capsule Take 200 mg by mouth 2 (two) times daily.   Yes [provider]  gabapentin (NEURONTIN) 100 MG capsule 300 mg. 1-2 as needed   Yes [provider]  methocarbamol (ROBAXIN) 750 MG tablet Take 750 mg by mouth every 8 (eight) hours as needed for muscle spasms.   Yes [provider]  omeprazole (PRILOSEC) 40 MG capsule omeprazole 40 mg capsule,delayed release 09/05/17  Yes [provider]  ondansetron (ZOFRAN ODT) 4 MG disintegrating tablet Take 1 tablet (4 mg total) by mouth every 8 (eight) hours as needed for nausea or vomiting. 10/01/19  Yes Hall-Potvin, [place], PA-C  valACYclovir (VALTREX) 500 MG tablet Take 500 mg by mouth daily.   Yes [provider]  cetirizine (ZYRTEC) 10 MG chewable tablet Chew  10 mg by mouth daily.    [provider]  DULoxetine (CYMBALTA) 20 MG capsule Take 60 mg by mouth daily.    [provider]  meloxicam (MOBIC) 15 MG tablet as needed. 08/22/17   [provider]  predniSONE (DELTASONE) 20 MG tablet Take 2 tablets (40 mg total) by mouth daily for 5 days. 10/15/19 10/20/19  Hall-Potvin, [place], PA-C    Family History Family History  Problem Relation Age of Onset  . Depression Mother   . Neuropathy Mother   . COPD Father   . Osteoarthritis Father   . Neuropathy Father   . Melanoma Sister   . Colon cancer Paternal  Grandmother     Social History Social History   Tobacco Use  . Smoking status: Former Smoker    Packs/day: 1.00    Types: Cigarettes    Quit date: 09/22/2017    Years since quitting: 2.0  . Smokeless tobacco: Never Used  Substance Use Topics  . Alcohol use: Yes    Comment: 4-6 beers weekly  . Drug use: No     Allergies   Patient has no known allergies.   Review of Systems Review of Systems  Constitutional: Negative for activity change, appetite change, fatigue and fever.  HENT: Negative for congestion, dental problem, ear pain, facial swelling, hearing loss, sinus pain, sore throat, trouble swallowing and voice change.   Eyes: Negative for photophobia, pain, redness and visual disturbance.  Respiratory: Positive for cough. Negative for shortness of breath and wheezing.   Cardiovascular: Negative for chest pain, palpitations and leg swelling.  Gastrointestinal: Negative for abdominal pain, diarrhea and vomiting.  Musculoskeletal: Negative for arthralgias and myalgias.  Skin: Negative for rash and wound.  Neurological: Negative for dizziness, syncope and headaches.     Physical Exam Triage Vital Signs ED Triage Vitals [10/15/19 1206]  Enc Vitals Group     BP (!) 144/96     Pulse Rate 84     Resp 16     Temp 98.5 F (36.9 C)     Temp Source Oral     SpO2 96 %     Weight      Height      Head Circumference      Peak Flow      Pain Score 4     Pain Loc      Pain Edu?      Excl. in GC?    No data found.  Updated Vital Signs BP (!) 144/96   Pulse 84   Temp 98.5 F (36.9 C) (Oral)   Resp 16   SpO2 96%   Visual Acuity Right Eye Distance:   Left Eye Distance:   Bilateral Distance:    Right Eye Near:   Left Eye Near:    Bilateral Near:     Physical Exam Constitutional:      General: She is not in acute distress.    Appearance: She is obese. She is not ill-appearing.  HENT:     Head: Normocephalic and atraumatic.     Mouth/Throat:     Mouth:  Mucous membranes are moist.     Pharynx: Oropharynx is clear.  Eyes:     General: No scleral icterus.    Pupils: Pupils are equal, round, and reactive to light.  Neck:     Musculoskeletal: Neck supple. No muscular tenderness.     Comments: Trachea midline, negative JVD Cardiovascular:     Rate and Rhythm: Normal rate and  regular rhythm.     Heart sounds: No murmur. No gallop.   Pulmonary:     Effort: Pulmonary effort is normal. No respiratory distress.     Breath sounds: No wheezing, rhonchi or rales.     Comments: Good air entry bilaterally without prolonged expiratory phase Musculoskeletal:     Right lower leg: No edema.     Left lower leg: No edema.  Lymphadenopathy:     Cervical: No cervical adenopathy.  Skin:    Capillary Refill: Capillary refill takes less than 2 seconds.     Coloration: Skin is not jaundiced or pale.     Findings: No rash.     Comments: Negative for mottling  Neurological:     General: No focal deficit present.     Mental Status: She is alert and oriented to person, place, and time.      UC Treatments / Results  Labs (all labs ordered are listed, but only abnormal results are displayed) Labs Reviewed  NOVEL CORONAVIRUS, NAA    EKG   Radiology Dg Chest 2 View  Result Date: 10/15/2019 CLINICAL DATA:  Productive cough. EXAM: CHEST - 2 VIEW COMPARISON:  None. FINDINGS: Cardiac contours upper limits of normal. Minimal bibasilar heterogeneous opacities. No pleural effusion or pneumothorax. Thoracic spine degenerative changes. IMPRESSION: Bibasilar heterogeneous opacities favored represent atelectasis. Infection not excluded. Electronically Signed   By: Lovey Newcomer M.D.   On: 10/15/2019 13:15    Procedures Procedures (including critical care time)  Medications Ordered in UC Medications - No data to display  Initial Impression / Assessment and Plan / UC Course  I have reviewed the triage vital signs and the nursing notes.  Pertinent labs &  imaging results that were available during my care of the patient were reviewed by me and considered in my medical decision making (see chart for details).     CXR obtained in office, reviewed by me and radiology: Bibasilar heterogenous opacities favoring atelectasis, though does not exclude infection.  Reviewed case with Dr. Meda Coffee who agrees with A/P: Likely post viral sequela-will trial steroid in conjunction with Mucinex DM, repeat Covid testing, have patient follow-up with PCP/pulmonology if persistent, worsening symptoms thereafter.  Sitting in no acute distress.  Patient is able to speak in full sentences without coughing, sneezing, wheezing. Final Clinical Impressions(s) / UC Diagnoses   Final diagnoses:  Cough  Post-viral reactive airway disease     Discharge Instructions     Take steroid as prescribed. May continue albuterol as needed. May take Mucinex DM for additional relief.  Your COVID test is pending - it is important to quarantine / isolate at home until your results are back. If you test positive and would like further evaluation for persistent or worsening symptoms, you may schedule an E-visit or virtual (video) visit throughout the [facility] app or website.  PLEASE NOTE: If you develop severe chest pain or shortness of breath please go to the ER or call 9-1-1 for further evaluation --> DO NOT schedule electronic or virtual visits for this. Please call our office for further guidance / recommendations as needed.    ED Prescriptions    Medication Sig Dispense Auth. Provider   predniSONE (DELTASONE) 20 MG tablet Take 2 tablets (40 mg total) by mouth daily for 5 days. 10 tablet Hall-Potvin, [place], PA-C     PDMP not reviewed this encounter.   Hall-Potvin, [place], PA-C 10/15/19 [place], [place], [place] 10/15/19 1746

## 2019-10-16 LAB — NOVEL CORONAVIRUS, NAA: SARS-CoV-2, NAA: NOT DETECTED

## 2020-05-15 IMAGING — DX DG CHEST 2V
2 series · 2 of 2 positions shown · non-contrast
Comparison: None.

CLINICAL DATA: Productive cough.

EXAM:
CHEST - 2 VIEW

[chest pa]
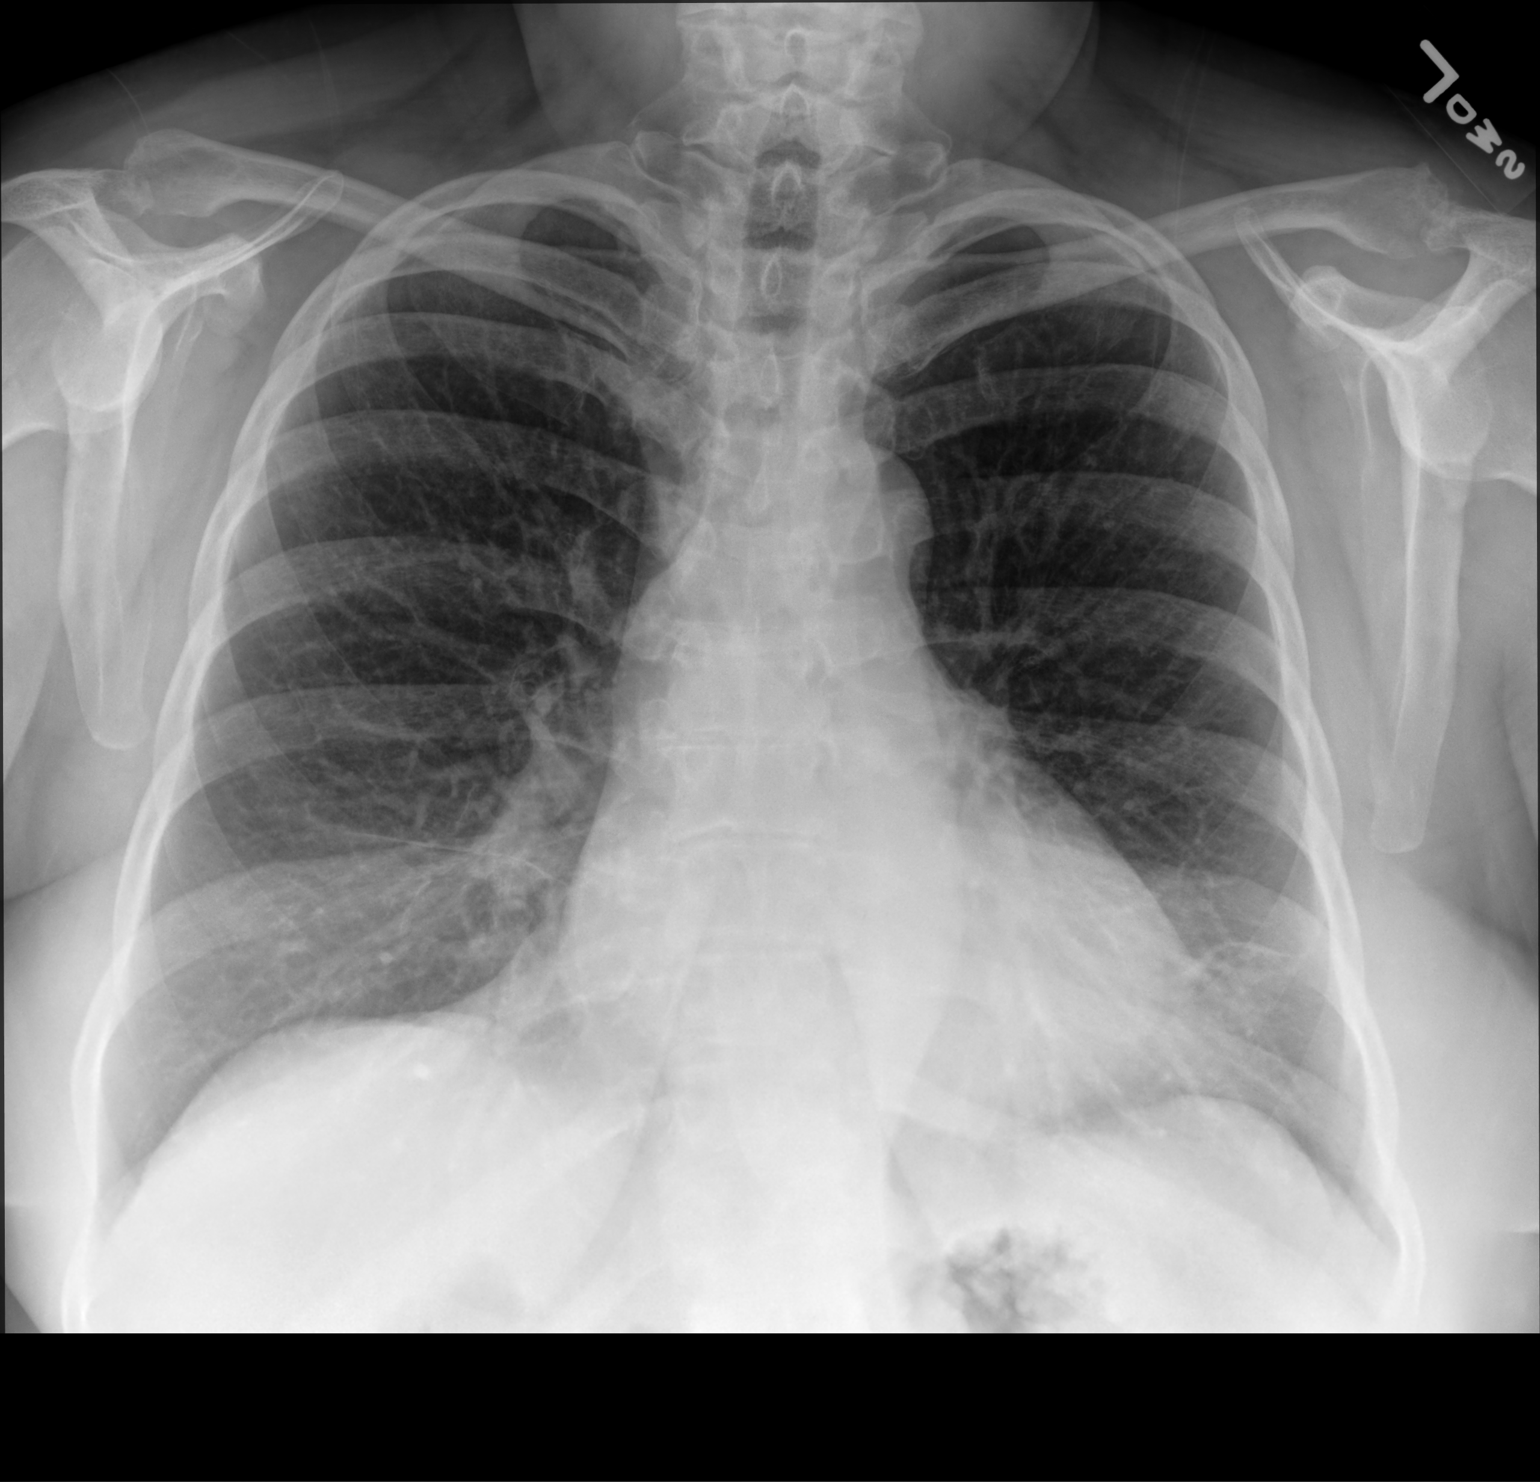

[chest lat]
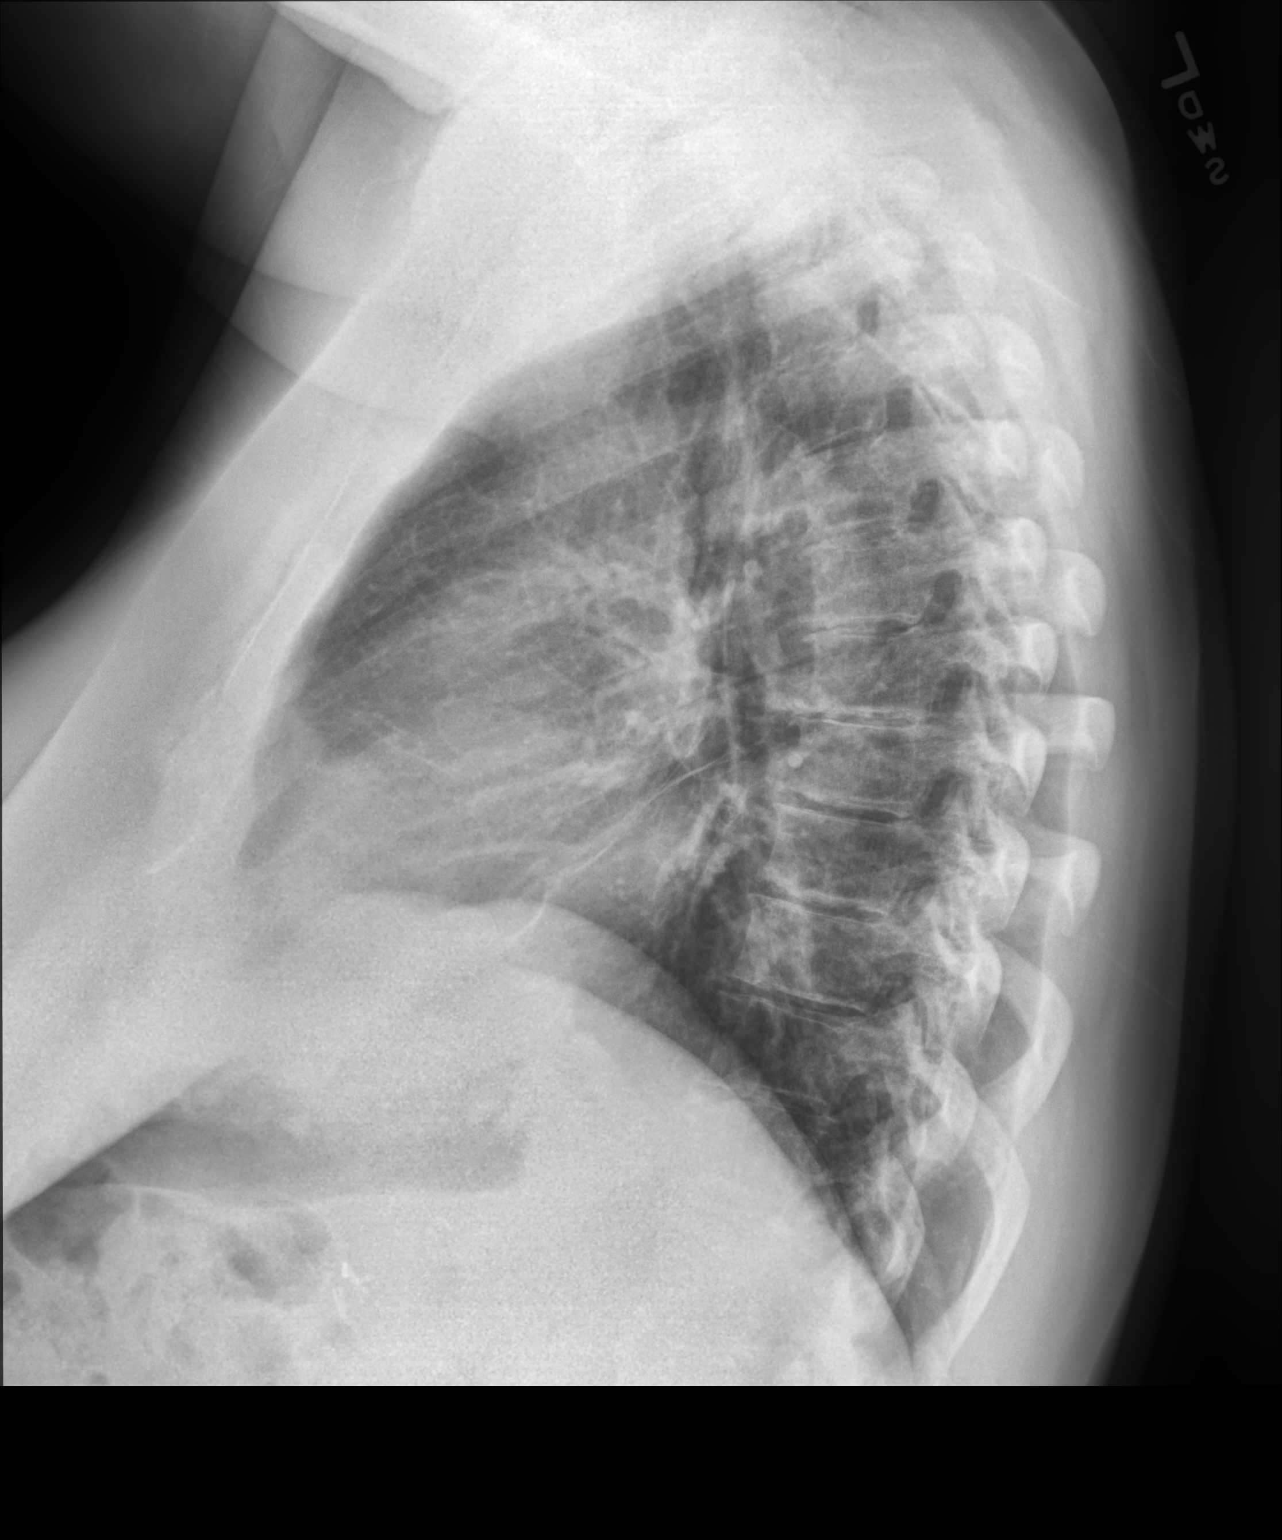

[2 of 2 positions shown; findings below may reference images not displayed]

FINDINGS: Cardiac contours upper limits of normal. Minimal bibasilar
heterogeneous opacities. No pleural effusion or pneumothorax.
Thoracic spine degenerative changes.
IMPRESSION: Bibasilar heterogeneous opacities favored represent atelectasis.
Infection not excluded.

## 2020-07-29 ENCOUNTER — Other Ambulatory Visit: Payer: Self-pay

## 2020-07-29 ENCOUNTER — Other Ambulatory Visit: Payer: Managed Care, Other (non HMO)

## 2020-07-29 DIAGNOSIS — Z0000 Encounter for general adult medical examination without abnormal findings: Secondary | ICD-10-CM

## 2020-07-29 LAB — HEMOGLOBIN A1C
Est. average glucose Bld gHb Est-mCnc: 120 mg/dL
Hgb A1c MFr Bld: 5.8 % — ABNORMAL HIGH (ref 4.8–5.6)

## 2020-07-30 LAB — SARS-COV-2 SEMI-QUANTITATIVE TOTAL ANTIBODY, SPIKE
SARS-CoV-2 Semi-Quant Total Ab: <0.4 U/mL (ref <0.8)
SARS-CoV-2 Spike Ab Interp: NEGATIVE

## 2020-07-30 LAB — CREATININE, SERUM
Creatinine, Ser: 0.88 mg/dL (ref 0.57–1.00)
GFR calc Af Amer: 87 mL/min/{1.73_m2} (ref >59)
GFR calc non Af Amer: 76 mL/min/{1.73_m2} (ref >59)

## 2020-07-30 LAB — LIPID PANEL
Chol/HDL Ratio: 3.6 ratio (ref 0.0–4.4)
Cholesterol, Total: 207 mg/dL — ABNORMAL HIGH (ref 100–199)
HDL: 57 mg/dL (ref >39)
LDL Chol Calc (NIH): 111 mg/dL — ABNORMAL HIGH (ref 0–99)
Triglycerides: 227 mg/dL — ABNORMAL HIGH (ref 0–149)
VLDL Cholesterol Cal: 39 mg/dL (ref 5–40)

## 2020-07-30 LAB — GLUCOSE, RANDOM: Glucose: 108 mg/dL — ABNORMAL HIGH (ref 65–99)

## 2020-09-06 ENCOUNTER — Ambulatory Visit (INDEPENDENT_AMBULATORY_CARE_PROVIDER_SITE_OTHER): Payer: Managed Care, Other (non HMO) | Admitting: *Deleted

## 2020-09-06 ENCOUNTER — Other Ambulatory Visit: Payer: Self-pay

## 2020-09-06 DIAGNOSIS — Z23 Encounter for immunization: Secondary | ICD-10-CM | POA: Diagnosis not present

## 2020-09-06 NOTE — Progress Notes (Signed)
Patient was assessed and managed by nursing staff during this encounter. I have reviewed the chart and agree with the documentation and plan. I have also made any necessary editorial changes.  Tishie Altmann, MD 09/06/2020 11:48 AM 

## 2020-09-06 NOTE — Progress Notes (Signed)
Pt here for flu shot, tolerated injection well. 

## 2021-05-07 DIAGNOSIS — M47812 Spondylosis without myelopathy or radiculopathy, cervical region: Secondary | ICD-10-CM | POA: Insufficient documentation

## 2021-05-07 DIAGNOSIS — M5412 Radiculopathy, cervical region: Secondary | ICD-10-CM | POA: Insufficient documentation

## 2021-07-07 ENCOUNTER — Ambulatory Visit (INDEPENDENT_AMBULATORY_CARE_PROVIDER_SITE_OTHER): Payer: Managed Care, Other (non HMO) | Admitting: *Deleted

## 2021-07-07 ENCOUNTER — Other Ambulatory Visit: Payer: Self-pay

## 2021-07-07 DIAGNOSIS — R3915 Urgency of urination: Secondary | ICD-10-CM

## 2021-07-07 LAB — POCT URINALYSIS DIPSTICK: Nitrite, UA: POSITIVE

## 2021-07-07 MED ORDER — PHENAZOPYRIDINE HCL 200 MG PO TABS: 200.0000 mg | ORAL_TABLET | Freq: Three times a day (TID) | ORAL | 1 refills | Status: DC | PRN

## 2021-07-07 MED ORDER — SULFAMETHOXAZOLE-TRIMETHOPRIM 800-160 MG PO TABS: 1.0000 | ORAL_TABLET | Freq: Two times a day (BID) | ORAL | 0 refills | Status: DC

## 2021-07-07 NOTE — Progress Notes (Signed)
SUBJECTIVE: Robin King is a 55 y.o. female who complains of urinary urgency for 2 days, without flank pain, fever, chills, or abnormal vaginal discharge or bleeding.   OBJECTIVE: Appears well, in no apparent distress.  Vital signs are normal. Urine dipstick shows positive for RBC's, positive for nitrates, and positive for leukocytes.    ASSESSMENT: Urinary Urgency   PLAN: Treatment per orders.  Call or return to clinic prn if these symptoms worsen or fail to improve as anticipated.

## 2021-07-07 NOTE — Progress Notes (Signed)
Patient was assessed and managed by nursing staff during this encounter. I have reviewed the chart and agree with the documentation and plan.   Jaynie Collins, MD 07/07/2021 1:14 PM

## 2021-07-09 LAB — URINE CULTURE

## 2021-07-25 ENCOUNTER — Ambulatory Visit
Admission: EM | Admit: 2021-07-25 | Discharge: 2021-07-25 | Disposition: A | Discharge status: Home / Self Care | Payer: Managed Care, Other (non HMO) | Attending: Urgent Care | Admitting: Urgent Care

## 2021-07-25 ENCOUNTER — Other Ambulatory Visit: Payer: Self-pay

## 2021-07-25 ENCOUNTER — Encounter: Payer: Self-pay | Admitting: Emergency Medicine

## 2021-07-25 DIAGNOSIS — R519 Headache, unspecified: Secondary | ICD-10-CM | POA: Insufficient documentation

## 2021-07-25 DIAGNOSIS — R059 Cough, unspecified: Secondary | ICD-10-CM | POA: Insufficient documentation

## 2021-07-25 DIAGNOSIS — R070 Pain in throat: Secondary | ICD-10-CM | POA: Diagnosis present

## 2021-07-25 DIAGNOSIS — J069 Acute upper respiratory infection, unspecified: Secondary | ICD-10-CM | POA: Diagnosis not present

## 2021-07-25 LAB — POCT RAPID STREP A (OFFICE): Rapid Strep A Screen: NEGATIVE

## 2021-07-25 MED ORDER — CETIRIZINE HCL 10 MG PO TABS: 10.0000 mg | ORAL_TABLET | Freq: Every day | ORAL | 0 refills | Status: DC

## 2021-07-25 MED ORDER — PROMETHAZINE-DM 6.25-15 MG/5ML PO SYRP: 5.0000 mL | ORAL_SOLUTION | Freq: Every evening | ORAL | 0 refills | Status: DC | PRN

## 2021-07-25 MED ORDER — BENZONATATE 100 MG PO CAPS: 100.0000 mg | ORAL_CAPSULE | Freq: Three times a day (TID) | ORAL | 0 refills | Status: DC | PRN

## 2021-07-25 MED ORDER — PSEUDOEPHEDRINE HCL 30 MG PO TABS: 30.0000 mg | ORAL_TABLET | Freq: Three times a day (TID) | ORAL | 0 refills | Status: DC | PRN

## 2021-07-25 NOTE — Discharge Instructions (Signed)

## 2021-07-25 NOTE — ED Provider Notes (Signed)
[facility]   MRN: [id number] DOB: 05/18/1966  Subjective:   Robin King is a 55 y.o. female presenting for 3-day history of acute onset moderate to severe throat pain, sinus headache, coughing. Quit smoking 4 years ago. No history of asthma, COPD or need for inhalers. She has COVID vaccination, no booster.  No chest pain, shortness of breath or wheezing.  Patient had multiple sick contacts while on vacation with her family.  No current facility-administered medications for this encounter.  Current Outpatient Medications:    amphetamine-dextroamphetamine (ADDERALL) 10 MG tablet, TAKE 1 TABLET BY MOUTH THREE TIMES A DAY, Disp: , Rfl:    benzonatate (TESSALON) 100 MG capsule, Take 1 capsule (100 mg total) by mouth every 8 (eight) hours., Disp: 21 capsule, Rfl: 0   buPROPion (WELLBUTRIN XL) 300 MG 24 hr tablet, bupropion HCl XL 300 mg 24 hr tablet, extended release, Disp: , Rfl:    celecoxib (CELEBREX) 200 MG capsule, Take 200 mg by mouth 2 (two) times daily., Disp: , Rfl:    cetirizine (ZYRTEC) 10 MG chewable tablet, Chew 10 mg by mouth daily., Disp: , Rfl:    DULoxetine (CYMBALTA) 20 MG capsule, Take 60 mg by mouth daily., Disp: , Rfl:    gabapentin (NEURONTIN) 100 MG capsule, 300 mg. 1-2 as needed, Disp: , Rfl:    meloxicam (MOBIC) 15 MG tablet, as needed., Disp: , Rfl:    methocarbamol (ROBAXIN) 750 MG tablet, Take 750 mg by mouth every 8 (eight) hours as needed for muscle spasms., Disp: , Rfl:    omeprazole (PRILOSEC) 40 MG capsule, omeprazole 40 mg capsule,delayed release, Disp: , Rfl:    ondansetron (ZOFRAN ODT) 4 MG disintegrating tablet, Take 1 tablet (4 mg total) by mouth every 8 (eight) hours as needed for nausea or vomiting., Disp: 21 tablet, Rfl: 0   phenazopyridine (PYRIDIUM) 200 MG tablet, Take 1 tablet (200 mg total) by mouth 3 (three) times daily as needed for pain (urethral spasm)., Disp: 10 tablet, Rfl: 1   sulfamethoxazole-trimethoprim (BACTRIM DS) 800-160  MG tablet, Take 1 tablet by mouth 2 (two) times daily., Disp: 10 tablet, Rfl: 0   valACYclovir (VALTREX) 500 MG tablet, Take 500 mg by mouth daily., Disp: , Rfl:    No Known Allergies  Past Medical History:  Diagnosis Date   Arthritis    DDD (degenerative disc disease), lumbar    Peripheral neuropathy    Reflux esophagitis      Past Surgical History:  Procedure Laterality Date   CARPAL TUNNEL RELEASE Right 2010, 09/2017   both   CESAREAN SECTION  1993, 1996   x 2   gall bladder  1994   lap choley   KNEE ARTHROSCOPY Right 1985, 1987    Family History  Problem Relation Age of Onset   Depression Mother    Neuropathy Mother    COPD Father    Osteoarthritis Father    Neuropathy Father    Melanoma Sister    Colon cancer Paternal Grandmother     Social History   Tobacco Use   Smoking status: Former    Packs/day: 1.00    Types: Cigarettes    Quit date: 09/22/2017    Years since quitting: 3.8   Smokeless tobacco: Never  Substance Use Topics   Alcohol use: Yes    Comment: 4-6 beers weekly   Drug use: No    ROS   Objective:   Vitals: BP (!) 146/75 (BP Location: Left Arm)   Pulse  100   Temp 98 F (36.7 C) (Oral)   Resp 18   SpO2 100%   Physical Exam Constitutional:      General: She is not in acute distress.    Appearance: Normal appearance. She is well-developed. She is not ill-appearing, toxic-appearing or diaphoretic.  HENT:     Head: Normocephalic and atraumatic.     Right Ear: Tympanic membrane and ear canal normal. No drainage or tenderness. No middle ear effusion. Tympanic membrane is not erythematous.     Left Ear: Tympanic membrane and ear canal normal. No drainage or tenderness.  No middle ear effusion. Tympanic membrane is not erythematous.     Nose: Nose normal. No congestion or rhinorrhea.     Mouth/Throat:     Mouth: Mucous membranes are moist. No oral lesions.     Pharynx: Posterior oropharyngeal erythema present. No pharyngeal swelling,  oropharyngeal exudate or uvula swelling.     Tonsils: No tonsillar exudate or tonsillar abscesses.  Eyes:     Extraocular Movements: Extraocular movements intact.     Right eye: Normal extraocular motion.     Left eye: Normal extraocular motion.     Conjunctiva/sclera: Conjunctivae normal.     Pupils: Pupils are equal, round, and reactive to light.  Cardiovascular:     Rate and Rhythm: Normal rate and regular rhythm.     Pulses: Normal pulses.     Heart sounds: Normal heart sounds. No murmur heard.   No friction rub. No gallop.  Pulmonary:     Effort: Pulmonary effort is normal. No respiratory distress.     Breath sounds: Normal breath sounds. No stridor. No wheezing, rhonchi or rales.  Musculoskeletal:     Cervical back: Normal range of motion and neck supple.  Lymphadenopathy:     Cervical: No cervical adenopathy.  Skin:    General: Skin is warm and dry.     Findings: No rash.  Neurological:     General: No focal deficit present.     Mental Status: She is alert and oriented to person, place, and time.  Psychiatric:        Mood and Affect: Mood normal.        Behavior: Behavior normal.        Thought Content: Thought content normal.    Results for orders placed or performed during the hospital encounter of 07/25/21 (from the past 24 hour(s))  POCT rapid strep A     Status: None   Collection Time: 07/25/21  3:50 PM  Result Value Ref Range   Rapid Strep A Screen Negative Negative    Assessment and Plan :   PDMP not reviewed this encounter.  1. Viral URI   2. Cough   3. Sinus headache   4. Throat pain     Will manage for viral illness such as viral URI, viral syndrome, viral rhinitis, COVID-19, viral pharyngitis. Counseled patient on nature of COVID-19 including modes of transmission, diagnostic testing, management and supportive care.  Offered scripts for symptomatic relief. COVID 19 and strep culture are pending. Counseled patient on potential for adverse effects with  medications prescribed/recommended today, ER and return-to-clinic precautions discussed, patient verbalized understanding.     Wallis Bamberg, [place] 07/25/21 [id number]

## 2021-07-25 NOTE — ED Triage Notes (Signed)
Sore throat, headache, cough since Wednesday while she was on vacation. Her grandchildren at the time were sick and she was exposed to them.

## 2021-07-26 LAB — SARS-COV-2, NAA 2 DAY TAT

## 2021-07-26 LAB — NOVEL CORONAVIRUS, NAA: SARS-CoV-2, NAA: NOT DETECTED

## 2021-07-28 LAB — CULTURE, GROUP A STREP (THRC)

## 2021-07-29 ENCOUNTER — Other Ambulatory Visit: Payer: Self-pay

## 2021-07-29 ENCOUNTER — Other Ambulatory Visit: Payer: Self-pay | Admitting: *Deleted

## 2021-07-29 ENCOUNTER — Ambulatory Visit: Payer: Managed Care, Other (non HMO)

## 2021-07-29 DIAGNOSIS — Z0000 Encounter for general adult medical examination without abnormal findings: Secondary | ICD-10-CM

## 2021-07-30 LAB — GLUCOSE, RANDOM: Glucose: 88 mg/dL (ref 65–99)

## 2021-07-30 LAB — HEMOGLOBIN A1C
Est. average glucose Bld gHb Est-mCnc: 117 mg/dL
Hgb A1c MFr Bld: 5.7 % — ABNORMAL HIGH (ref 4.8–5.6)

## 2021-07-30 LAB — LIPID PANEL
Chol/HDL Ratio: 3.6 ratio (ref 0.0–4.4)
Cholesterol, Total: 201 mg/dL — ABNORMAL HIGH (ref 100–199)
HDL: 56 mg/dL (ref >39)
LDL Chol Calc (NIH): 109 mg/dL — ABNORMAL HIGH (ref 0–99)
Triglycerides: 209 mg/dL — ABNORMAL HIGH (ref 0–149)
VLDL Cholesterol Cal: 36 mg/dL (ref 5–40)

## 2021-07-30 LAB — SARS-COV-2 SEMI-QUANTITATIVE TOTAL ANTIBODY, SPIKE: SARS-CoV-2 Spike Ab Interp: POSITIVE

## 2021-07-30 LAB — CREATININE, SERUM
Creatinine, Ser: 0.86 mg/dL (ref 0.57–1.00)
eGFR: 81 mL/min/{1.73_m2} (ref >59)

## 2021-07-30 LAB — SARS-COV-2 SPIKE AB DILUTION: SARS-CoV-2 Spike Ab Dilution: 861 U/mL (ref <0.8)

## 2021-10-09 ENCOUNTER — Ambulatory Visit (INDEPENDENT_AMBULATORY_CARE_PROVIDER_SITE_OTHER): Payer: Managed Care, Other (non HMO) | Admitting: *Deleted

## 2021-10-09 ENCOUNTER — Other Ambulatory Visit: Payer: Self-pay

## 2021-10-09 DIAGNOSIS — Z23 Encounter for immunization: Secondary | ICD-10-CM

## 2021-10-09 NOTE — Progress Notes (Signed)
Pt here for Flu vaccine, tolerated injection well.

## 2021-10-09 NOTE — Progress Notes (Signed)
Patient was assessed and managed by nursing staff during this encounter. I have reviewed the chart and agree with the documentation and plan.   Jaynie Collins, MD 10/09/2021 10:22 AM

## 2022-08-04 ENCOUNTER — Other Ambulatory Visit: Payer: Managed Care, Other (non HMO)

## 2022-08-04 DIAGNOSIS — Z0000 Encounter for general adult medical examination without abnormal findings: Secondary | ICD-10-CM

## 2022-08-05 LAB — LIPID PANEL
Chol/HDL Ratio: 2.9 ratio (ref 0.0–4.4)
Cholesterol, Total: 216 mg/dL — ABNORMAL HIGH (ref 100–199)
HDL: 74 mg/dL (ref >39)
LDL Chol Calc (NIH): 109 mg/dL — ABNORMAL HIGH (ref 0–99)
Triglycerides: 191 mg/dL — ABNORMAL HIGH (ref 0–149)
VLDL Cholesterol Cal: 33 mg/dL (ref 5–40)

## 2022-08-05 LAB — HEMOGLOBIN A1C
Est. average glucose Bld gHb Est-mCnc: 117 mg/dL
Hgb A1c MFr Bld: 5.7 % — ABNORMAL HIGH (ref 4.8–5.6)

## 2022-08-05 LAB — CREATININE, SERUM
Creatinine, Ser: 0.83 mg/dL (ref 0.57–1.00)
eGFR: 84 mL/min/{1.73_m2} (ref >59)

## 2022-08-05 LAB — GLUCOSE, RANDOM: Glucose: 101 mg/dL — ABNORMAL HIGH (ref 70–99)

## 2022-09-10 ENCOUNTER — Ambulatory Visit: Payer: Managed Care, Other (non HMO) | Admitting: *Deleted

## 2022-09-10 DIAGNOSIS — Z23 Encounter for immunization: Secondary | ICD-10-CM

## 2022-09-10 NOTE — Progress Notes (Signed)
Pt here for flu shot, tolerated injection well. 

## 2022-09-20 ENCOUNTER — Ambulatory Visit
Admission: EM | Admit: 2022-09-20 | Discharge: 2022-09-20 | Disposition: A | Discharge status: Home / Self Care | Payer: Managed Care, Other (non HMO) | Attending: Emergency Medicine | Admitting: Emergency Medicine

## 2022-09-20 ENCOUNTER — Ambulatory Visit: Payer: Managed Care, Other (non HMO)

## 2022-09-20 DIAGNOSIS — H1033 Unspecified acute conjunctivitis, bilateral: Secondary | ICD-10-CM | POA: Diagnosis not present

## 2022-09-20 DIAGNOSIS — R059 Cough, unspecified: Secondary | ICD-10-CM

## 2022-09-20 DIAGNOSIS — J988 Other specified respiratory disorders: Secondary | ICD-10-CM | POA: Diagnosis not present

## 2022-09-20 MED ORDER — CIPROFLOXACIN HCL 0.3 % OP SOLN: OPHTHALMIC | 0 refills | Status: DC

## 2022-09-20 MED ORDER — ALBUTEROL SULFATE HFA 108 (90 BASE) MCG/ACT IN AERS: 2.0000 | INHALATION_SPRAY | Freq: Four times a day (QID) | RESPIRATORY_TRACT | 0 refills | Status: DC | PRN

## 2022-09-20 MED ORDER — GUAIFENESIN 400 MG PO TABS: ORAL_TABLET | ORAL | 0 refills | Status: DC

## 2022-09-20 MED ORDER — AEROCHAMBER PLUS FLO-VU LARGE MISC: 1.0000 | Freq: Once | 0 refills | Status: AC

## 2022-09-20 MED ORDER — PROMETHAZINE-DM 6.25-15 MG/5ML PO SYRP
5.0000 mL | ORAL_SOLUTION | Freq: Four times a day (QID) | ORAL | 0 refills | Status: DC | PRN
Start: 2022-09-20 — End: 2023-12-27

## 2022-09-20 MED ORDER — OLOPATADINE HCL 0.2 % OP SOLN: 1.0000 [drp] | Freq: Every day | OPHTHALMIC | 1 refills | Status: DC

## 2022-09-20 NOTE — ED Triage Notes (Signed)
Pt presents with productive cough with colored mucous, sore throat and bilateral eye irritation & pain for past week.

## 2022-09-20 NOTE — ED Provider Notes (Signed)
[facility]    CSN: [id number] Arrival date & time: 09/20/22  0849    HISTORY   Chief Complaint  Patient presents with   Cough   Sore Throat   Conjunctivitis   HPI Robin King is a pleasant, 56 y.o. female who presents to urgent care today. Pt complains of cough productive of dark-colored mucus, sore throat and bilateral eye irritation for 1 week.  Patient has normal vital signs on arrival.  Patient reports being a former smoker.  The history is provided by the patient.  Cough Cough characteristics:  Productive Sputum characteristics:  Nondescript Severity:  Moderate Onset quality:  Gradual Duration:  1 week Timing:  Constant Progression:  Waxing and waning Chronicity:  New Context: sick contacts and upper respiratory infection   Context: not exposure to allergens, not smoke exposure, not weather changes and not with activity   Relieved by:  Cough suppressants and decongestant Worsened by:  Deep breathing, activity, exposure to cold air and lying down Ineffective treatments:  None tried Associated symptoms: eye discharge, rhinorrhea, sinus congestion and sore throat   Associated symptoms: no chest pain, no chills, no diaphoresis, no ear fullness, no ear pain, no fever, no headaches, no myalgias, no rash, no shortness of breath, no weight loss and no wheezing   Risk factors: recent infection   Sore Throat Pertinent negatives include no chest pain, no headaches and no shortness of breath.  Conjunctivitis This is a new problem. The current episode started 2 days ago. The problem occurs constantly. The problem has been gradually worsening. Pertinent negatives include no chest pain, no headaches and no shortness of breath. Associated symptoms comments: Exposure to grandchild with bacterial conjunctivitis. Nothing aggravates the symptoms. She has tried nothing for the symptoms.   Past Medical History:  Diagnosis Date   Arthritis    DDD (degenerative disc  disease), lumbar    Peripheral neuropathy    Reflux esophagitis    There are no problems to display for this patient.  Past Surgical History:  Procedure Laterality Date   CARPAL TUNNEL RELEASE Right 2010, 09/2017   both   CESAREAN SECTION  1993, 1996   x 2   gall bladder  1994   lap choley   KNEE ARTHROSCOPY Right 1985, 1987   OB History   No obstetric history on file.    Home Medications    Prior to Admission medications   Medication Sig Start Date End Date Taking? Authorizing Provider  amphetamine-dextroamphetamine (ADDERALL) 10 MG tablet TAKE 1 TABLET BY MOUTH THREE TIMES A DAY 09/02/17   [provider]  benzonatate (TESSALON) 100 MG capsule Take 1-2 capsules (100-200 mg total) by mouth 3 (three) times daily as needed. 07/25/21   Wallis Bamberg, PA-C  buPROPion (WELLBUTRIN XL) 300 MG 24 hr tablet bupropion HCl XL 300 mg 24 hr tablet, extended release 06/30/17   [provider]  celecoxib (CELEBREX) 200 MG capsule Take 200 mg by mouth 2 (two) times daily.    [provider]  cetirizine (ZYRTEC ALLERGY) 10 MG tablet Take 1 tablet (10 mg total) by mouth daily. 07/25/21   Wallis Bamberg, PA-C  DULoxetine (CYMBALTA) 20 MG capsule Take 60 mg by mouth daily.    [provider]  gabapentin (NEURONTIN) 100 MG capsule 300 mg. 1-2 as needed    [provider]  meloxicam (MOBIC) 15 MG tablet as needed. 08/22/17   [provider]  methocarbamol (ROBAXIN) 750 MG tablet Take 750 mg by  mouth every 8 (eight) hours as needed for muscle spasms.    [provider]  omeprazole (PRILOSEC) 40 MG capsule omeprazole 40 mg capsule,delayed release 09/05/17   [provider]  ondansetron (ZOFRAN ODT) 4 MG disintegrating tablet Take 1 tablet (4 mg total) by mouth every 8 (eight) hours as needed for nausea or vomiting. 10/01/19   Hall-Potvin, [place], PA-C  phenazopyridine (PYRIDIUM) 200 MG tablet Take 1 tablet (200 mg total) by mouth 3 (three)  times daily as needed for pain (urethral spasm). 07/07/21   Anyanwu, Jethro Bastos, MD  promethazine-dextromethorphan (PROMETHAZINE-DM) 6.25-15 MG/5ML syrup Take 5 mLs by mouth at bedtime as needed for cough. 07/25/21   Wallis Bamberg, PA-C  pseudoephedrine (SUDAFED) 30 MG tablet Take 1 tablet (30 mg total) by mouth every 8 (eight) hours as needed for congestion. 07/25/21   Wallis Bamberg, PA-C  sulfamethoxazole-trimethoprim (BACTRIM DS) 800-160 MG tablet Take 1 tablet by mouth 2 (two) times daily. 07/07/21   Anyanwu, Jethro Bastos, MD  valACYclovir (VALTREX) 500 MG tablet Take 500 mg by mouth daily.    [provider]    Family History Family History  Problem Relation Age of Onset   Depression Mother    Neuropathy Mother    COPD Father    Osteoarthritis Father    Neuropathy Father    Melanoma Sister    Colon cancer Paternal Grandmother    Social History Social History   Tobacco Use   Smoking status: Former    Packs/day: 1.00    Types: Cigarettes    Quit date: 09/22/2017    Years since quitting: 5.0   Smokeless tobacco: Never  Substance Use Topics   Alcohol use: Yes    Comment: 4-6 beers weekly   Drug use: No   Allergies   Patient has no known allergies.  Review of Systems Review of Systems  Constitutional:  Negative for chills, diaphoresis, fever and weight loss.  HENT:  Positive for rhinorrhea and sore throat. Negative for ear pain.   Eyes:  Positive for discharge.  Respiratory:  Positive for cough. Negative for shortness of breath and wheezing.   Cardiovascular:  Negative for chest pain.  Musculoskeletal:  Negative for myalgias.  Skin:  Negative for rash.  Neurological:  Negative for headaches.   Pertinent findings revealed after performing a 14 point review of systems has been noted in the history of present illness.  Physical Exam Triage Vital Signs ED Triage Vitals  Enc Vitals Group     BP 10/03/21 0827 (!) 147/82     Pulse Rate 10/03/21 0827 72     Resp 10/03/21 0827  18     Temp 10/03/21 0827 98.3 F (36.8 C)     Temp Source 10/03/21 0827 Oral     SpO2 10/03/21 0827 98 %     Weight --      Height --      Head Circumference --      Peak Flow --      Pain Score 10/03/21 0826 5     Pain Loc --      Pain Edu? --      Excl. in GC? --   No data found.  Updated Vital Signs BP 130/84 (BP Location: Left Arm)   Pulse 69   Temp 98.2 F (36.8 C) (Oral)   Resp 18   SpO2 95%   Physical Exam Vitals and nursing note reviewed.  Constitutional:      General: She is not in  acute distress.    Appearance: Normal appearance. She is not ill-appearing.  HENT:     Head: Normocephalic and atraumatic.     Salivary Glands: Right salivary gland is not diffusely enlarged or tender. Left salivary gland is not diffusely enlarged or tender.     Right Ear: Tympanic membrane, ear canal and external ear normal. No drainage. No middle ear effusion. There is no impacted cerumen. Tympanic membrane is not erythematous or bulging.     Left Ear: Tympanic membrane, ear canal and external ear normal. No drainage.  No middle ear effusion. There is no impacted cerumen. Tympanic membrane is not erythematous or bulging.     Nose: Nose normal. No nasal deformity, septal deviation, mucosal edema, congestion or rhinorrhea.     Right Turbinates: Not enlarged, swollen or pale.     Left Turbinates: Not enlarged, swollen or pale.     Right Sinus: No maxillary sinus tenderness or frontal sinus tenderness.     Left Sinus: No maxillary sinus tenderness or frontal sinus tenderness.     Mouth/Throat:     Lips: Pink. No lesions.     Mouth: Mucous membranes are moist. No oral lesions.     Pharynx: Oropharynx is clear. Uvula midline. No posterior oropharyngeal erythema or uvula swelling.     Tonsils: No tonsillar exudate. 0 on the right. 0 on the left.  Eyes:     General: Lids are normal. Lids are everted, no foreign bodies appreciated. No allergic shiner.       Right eye: Discharge present.         Left eye: Discharge present.    Extraocular Movements: Extraocular movements intact.     Conjunctiva/sclera:     Right eye: Right conjunctiva is injected. Exudate present.     Left eye: Left conjunctiva is injected. Exudate present.  Neck:     Trachea: Trachea and phonation normal.  Cardiovascular:     Rate and Rhythm: Normal rate and regular rhythm.     Pulses: Normal pulses.     Heart sounds: Normal heart sounds. No murmur heard.    No friction rub. No gallop.  Pulmonary:     Effort: Pulmonary effort is normal. No accessory muscle usage, prolonged expiration or respiratory distress.     Breath sounds: No stridor, decreased air movement or transmitted upper airway sounds. Examination of the right-lower field reveals rhonchi and rales. Examination of the left-lower field reveals rhonchi and rales. Rhonchi and rales present. No decreased breath sounds or wheezing.  Chest:     Chest wall: No tenderness.  Musculoskeletal:        General: Normal range of motion.     Cervical back: Normal range of motion and neck supple. Normal range of motion.  Lymphadenopathy:     Cervical: No cervical adenopathy.  Skin:    General: Skin is warm and dry.     Findings: No erythema or rash.  Neurological:     General: No focal deficit present.     Mental Status: She is alert and oriented to person, place, and time.  Psychiatric:        Mood and Affect: Mood normal.        Behavior: Behavior normal.     Visual Acuity Right Eye Distance:   Left Eye Distance:   Bilateral Distance:    Right Eye Near:   Left Eye Near:    Bilateral Near:     [facility] Couse / Diagnostics / Procedures:  Radiology DG Chest 2 View  Result Date: 09/20/2022 CLINICAL DATA:  Productive cough. EXAM: CHEST - 2 VIEW COMPARISON:  10/15/2019. FINDINGS: Cardiac silhouette is normal in size. No mediastinal or hilar masses. No evidence of adenopathy. Linear opacities in the right middle lobe and left upper lobe lingula  consistent with atelectasis/scarring, stable. Lungs otherwise clear. No pleural effusion or pneumothorax. Skeletal structures are intact. IMPRESSION: No active cardiopulmonary disease. Electronically Signed   By: Amie Portlandavid  Ormond M.D.   On: 09/20/2022 10:31    Procedures Procedures (including critical care time) EKG  Pending results:  Labs Reviewed - No data to display  Medications Ordered in UC: Medications - No data to display  UC Diagnoses / Final Clinical Impressions(s)   I have reviewed the triage vital signs and the nursing notes.  Pertinent labs & imaging results that were available during my care of the patient were reviewed by me and considered in my medical decision making (see chart for details).    Final diagnoses:  Acute conjunctivitis of both eyes, unspecified acute conjunctivitis type  Respiratory infection   Patient provided with ciprofloxacin eyedrops for bacterial conjunctivitis along with Pataday to help alleviate redness and irritation.  Patient provided with supportive medications for her cough.  Patient advised of chest x-ray findings.  Viral testing not indicated due to duration of symptoms.  Return precautions advised.  ED Prescriptions     Medication Sig Dispense Auth. Provider   guaifenesin (HUMIBID E) 400 MG TABS tablet Take 1 tablet 3 times daily as needed for chest congestion and cough 21 tablet Theadora RamaMorgan, Tacey Dimaggio Scales, PA-C   promethazine-dextromethorphan (PROMETHAZINE-DM) 6.25-15 MG/5ML syrup Take 5 mLs by mouth 4 (four) times daily as needed for cough. 118 mL Theadora RamaMorgan, Tasmine Hipwell Scales, PA-C   ciprofloxacin (CILOXAN) 0.3 % ophthalmic solution Administer 1 drop, every 2 hours, while awake, for 2 days. Then 1 drop, every 4 hours, while awake, for the next 5 days. 5 mL Theadora RamaMorgan, Dominique Ressel Scales, PA-C   Olopatadine HCl (PATADAY) 0.2 % SOLN Apply 1 drop to eye daily. 2.5 mL Theadora RamaMorgan, Aaron Boeh Scales, PA-C   albuterol (VENTOLIN HFA) 108 (90 Base) MCG/ACT inhaler Inhale 2  puffs into the lungs every 6 (six) hours as needed for wheezing or shortness of breath (Cough). 18 g Theadora RamaMorgan, Crystin Lechtenberg Scales, PA-C   Spacer/Aero-Holding Chambers (AEROCHAMBER PLUS FLO-VU LARGE) MISC 1 each by Other route once for 1 dose. 1 each Theadora RamaMorgan, Janna Oak Scales, PA-C      PDMP not reviewed this encounter.  Disposition Upon Discharge:  Condition: stable for discharge home Home: take medications as prescribed; routine discharge instructions as discussed; follow up as advised.  Patient presented with an acute illness with associated systemic symptoms and significant discomfort requiring urgent management. In my opinion, this is a condition that a prudent lay person (someone who possesses an average knowledge of health and medicine) may potentially expect to result in complications if not addressed urgently such as respiratory distress, impairment of bodily function or dysfunction of bodily organs.   Routine symptom specific, illness specific and/or disease specific instructions were discussed with the patient and/or caregiver at length.   As such, the patient has been evaluated and assessed, work-up was performed and treatment was provided in alignment with urgent care protocols and evidence based medicine.  Patient/parent/caregiver has been advised that the patient may require follow up for further testing and treatment if the symptoms continue in spite of treatment, as clinically indicated and appropriate.  If the patient was tested  for COVID-19, Influenza and/or RSV, then the patient/parent/guardian was advised to isolate at home pending the results of his/her diagnostic coronavirus test and potentially longer if they're positive. I have also advised pt that if his/her COVID-19 test returns positive, it's recommended to self-isolate for at least 10 days after symptoms first appeared AND until fever-free for 24 hours without fever reducer AND other symptoms have improved or resolved. Discussed  self-isolation recommendations as well as instructions for household member/close contacts as per the [facility] and [place] [facility], and also gave patient the COVID packet with this information.  Patient/parent/caregiver has been advised to return to the [facility] or PCP in 3-5 days if no better; to PCP or the Emergency Department if new signs and symptoms develop, or if the current signs or symptoms continue to change or worsen for further workup, evaluation and treatment as clinically indicated and appropriate  The patient will follow up with their current PCP if and as advised. If the patient does not currently have a PCP we will assist them in obtaining one.   The patient may need specialty follow up if the symptoms continue, in spite of conservative treatment and management, for further workup, evaluation, consultation and treatment as clinically indicated and appropriate.  Patient/parent/caregiver verbalized understanding and agreement of plan as discussed.  All questions were addressed during visit.  Please see discharge instructions below for further details of plan.  Discharge Instructions:   Discharge Instructions      Your x-ray today, per my personal read, is not concerning for pneumonia.  Please see the list below for recommended medications, dosages and frequencies to provide relief of current symptoms:     Ciloxan (ciprofloxacin): Please instill 1 drop into each eye every 2 hours while awake for the first 2 days, then on days 3 through 7 instill 1 drop into each eye every 4 hours while awake.  ProAir, Ventolin, Proventil (albuterol): This inhaled medication contains a short acting beta agonist bronchodilator.  This medication works on the smooth muscle that opens and constricts of your airways by relaxing the muscle.  The result of relaxation of the smooth muscle is increased air movement and improved work of breathing.  This is a short acting medication that can be used every 4-6 hours as needed  for increased work of breathing, shortness of breath, wheezing and excessive coughing.  I have provided you with a prescription.   ProAir, Ventolin, Proventil (albuterol): This inhaled medication contains a short acting beta agonist bronchodilator.  This medication works on the smooth muscle that opens and constricts of your airways by relaxing the muscle.  The result of relaxation of the smooth muscle is increased air movement and improved work of breathing.  This is a short acting medication that can be used every 4-6 hours as needed for increased work of breathing, shortness of breath, wheezing and excessive coughing.  I have provided you with a prescription.    Robitussin, Mucinex (guaifenesin): This is an expectorant.  This helps break up chest congestion and loosen up thick nasal drainage making phlegm and drainage more liquid and therefore easier to remove.  I recommend being 400 mg three times daily as needed.      Promethazine DM: Promethazine is both a nasal decongestant and an antinausea medication that makes most patients feel fairly sleepy.  The DM is dextromethorphan, a cough suppressant found in many over-the-counter cough medications.  Please take 5 mL before bedtime to minimize your cough which will help you sleep  better.  I have sent a prescription for this medication to your pharmacy.   Please follow-up within the next 5-7 days either with your primary care provider or urgent care if your symptoms do not resolve.  If you do not have a primary care provider, we will assist you in finding one.        Thank you for visiting urgent care today.  We appreciate the opportunity to participate in your care.         This office note has been dictated using Museum/gallery curator.  Unfortunately, this method of dictation can sometimes lead to typographical or grammatical errors.  I apologize for your inconvenience in advance if this occurs.  Please do not hesitate to reach out  to me if clarification is needed.      Lynden Oxford Scales, PA-C 09/21/22 1150

## 2022-09-20 NOTE — Discharge Instructions (Signed)
Your x-ray today, per my personal read, is not concerning for pneumonia.  Please see the list below for recommended medications, dosages and frequencies to provide relief of current symptoms:     Ciloxan (ciprofloxacin): Please instill 1 drop into each eye every 2 hours while awake for the first 2 days, then on days 3 through 7 instill 1 drop into each eye every 4 hours while awake.  ProAir, Ventolin, Proventil (albuterol): This inhaled medication contains a short acting beta agonist bronchodilator.  This medication works on the smooth muscle that opens and constricts of your airways by relaxing the muscle.  The result of relaxation of the smooth muscle is increased air movement and improved work of breathing.  This is a short acting medication that can be used every 4-6 hours as needed for increased work of breathing, shortness of breath, wheezing and excessive coughing.  I have provided you with a prescription.   ProAir, Ventolin, Proventil (albuterol): This inhaled medication contains a short acting beta agonist bronchodilator.  This medication works on the smooth muscle that opens and constricts of your airways by relaxing the muscle.  The result of relaxation of the smooth muscle is increased air movement and improved work of breathing.  This is a short acting medication that can be used every 4-6 hours as needed for increased work of breathing, shortness of breath, wheezing and excessive coughing.  I have provided you with a prescription.    Robitussin, Mucinex (guaifenesin): This is an expectorant.  This helps break up chest congestion and loosen up thick nasal drainage making phlegm and drainage more liquid and therefore easier to remove.  I recommend being 400 mg three times daily as needed.      Promethazine DM: Promethazine is both a nasal decongestant and an antinausea medication that makes most patients feel fairly sleepy.  The DM is dextromethorphan, a cough suppressant found in many  over-the-counter cough medications.  Please take 5 mL before bedtime to minimize your cough which will help you sleep better.  I have sent a prescription for this medication to your pharmacy.   Please follow-up within the next 5-7 days either with your primary care provider or urgent care if your symptoms do not resolve.  If you do not have a primary care provider, we will assist you in finding one.        Thank you for visiting urgent care today.  We appreciate the opportunity to participate in your care.

## 2023-04-15 DIAGNOSIS — M170 Bilateral primary osteoarthritis of knee: Secondary | ICD-10-CM | POA: Insufficient documentation

## 2023-08-03 ENCOUNTER — Other Ambulatory Visit: Payer: Self-pay | Admitting: Obstetrics and Gynecology

## 2023-08-03 DIAGNOSIS — Z720 Tobacco use: Secondary | ICD-10-CM

## 2023-08-04 ENCOUNTER — Other Ambulatory Visit: Payer: Managed Care, Other (non HMO)

## 2023-08-04 DIAGNOSIS — Z0000 Encounter for general adult medical examination without abnormal findings: Secondary | ICD-10-CM

## 2023-08-05 LAB — COMPREHENSIVE METABOLIC PANEL
ALT: 20 IU/L (ref 0–32)
AST: 17 IU/L (ref 0–40)
Albumin: 4.6 g/dL (ref 3.8–4.9)
Alkaline Phosphatase: 81 IU/L (ref 44–121)
BUN/Creatinine Ratio: 21 (ref 9–23)
BUN: 17 mg/dL (ref 6–24)
Bilirubin Total: 0.3 mg/dL (ref 0.0–1.2)
CO2: 25 mmol/L (ref 20–29)
Calcium: 9.4 mg/dL (ref 8.7–10.2)
Chloride: 104 mmol/L (ref 96–106)
Creatinine, Ser: 0.81 mg/dL (ref 0.57–1.00)
Globulin, Total: 1.9 g/dL (ref 1.5–4.5)
Glucose: 94 mg/dL (ref 70–99)
Potassium: 4.2 mmol/L (ref 3.5–5.2)
Sodium: 143 mmol/L (ref 134–144)
Total Protein: 6.5 g/dL (ref 6.0–8.5)
eGFR: 86 mL/min/{1.73_m2} (ref >59)

## 2023-08-05 LAB — HEMOGLOBIN A1C
Est. average glucose Bld gHb Est-mCnc: 117 mg/dL
Hgb A1c MFr Bld: 5.7 % — ABNORMAL HIGH (ref 4.8–5.6)

## 2023-08-05 LAB — LIPID PANEL
Chol/HDL Ratio: 2.7 ratio (ref 0.0–4.4)
Cholesterol, Total: 191 mg/dL (ref 100–199)
HDL: 72 mg/dL (ref >39)
LDL Chol Calc (NIH): 103 mg/dL — ABNORMAL HIGH (ref 0–99)
Triglycerides: 89 mg/dL (ref 0–149)
VLDL Cholesterol Cal: 16 mg/dL (ref 5–40)

## 2023-08-06 ENCOUNTER — Encounter: Payer: Self-pay | Admitting: Obstetrics and Gynecology

## 2023-08-23 ENCOUNTER — Ambulatory Visit
Admission: RE | Admit: 2023-08-23 | Discharge: 2023-08-23 | Disposition: A | Discharge status: Home / Self Care | Payer: Managed Care, Other (non HMO) | Source: Ambulatory Visit | Attending: Obstetrics and Gynecology | Admitting: Obstetrics and Gynecology

## 2023-08-23 DIAGNOSIS — Z720 Tobacco use: Secondary | ICD-10-CM

## 2023-09-09 ENCOUNTER — Ambulatory Visit: Payer: Managed Care, Other (non HMO)

## 2023-09-09 DIAGNOSIS — Z23 Encounter for immunization: Secondary | ICD-10-CM

## 2023-09-09 NOTE — Progress Notes (Signed)
Pt here for flu vaccine. Pt tolerated well.

## 2023-09-21 ENCOUNTER — Other Ambulatory Visit: Payer: Managed Care, Other (non HMO)

## 2023-09-21 DIAGNOSIS — R300 Dysuria: Secondary | ICD-10-CM | POA: Diagnosis not present

## 2023-09-21 LAB — POCT URINALYSIS DIPSTICK: Nitrite, UA: POSITIVE

## 2023-09-21 MED ORDER — SULFAMETHOXAZOLE-TRIMETHOPRIM 800-160 MG PO TABS: 1.0000 | ORAL_TABLET | Freq: Two times a day (BID) | ORAL | 0 refills | Status: DC

## 2023-09-21 NOTE — Progress Notes (Signed)
SUBJECTIVE: Robin King is a 57 y.o. female who complains of urinary frequency, urgency and dysuria x 2 days, without flank pain, fever, chills, or abnormal vaginal discharge or bleeding.   OBJECTIVE: Appears well, in no apparent distress.  Vital signs are normal. Urine dipstick shows positive for RBC's and positive for nitrates.    ASSESSMENT: Dysuria  PLAN: Medication sent in. Call or return to clinic prn if these symptoms worsen or fail to improve as anticipated.   Robin Marten, RN

## 2023-09-27 LAB — URINE CULTURE

## 2023-09-28 ENCOUNTER — Other Ambulatory Visit: Payer: Self-pay | Admitting: *Deleted

## 2023-09-28 DIAGNOSIS — Z79899 Other long term (current) drug therapy: Secondary | ICD-10-CM

## 2023-09-30 LAB — COMPLIANCE DRUG ANALYSIS, UR

## 2023-11-15 ENCOUNTER — Encounter: Payer: Self-pay | Admitting: Cardiovascular Disease

## 2023-11-15 ENCOUNTER — Ambulatory Visit: Payer: Managed Care, Other (non HMO) | Attending: Cardiovascular Disease | Admitting: Cardiovascular Disease

## 2023-11-15 VITALS — BP 114/88 | HR 85 | Ht 66.0 in | Wt 230.0 lb

## 2023-11-15 DIAGNOSIS — I2510 Atherosclerotic heart disease of native coronary artery without angina pectoris: Secondary | ICD-10-CM

## 2023-11-15 DIAGNOSIS — Z136 Encounter for screening for cardiovascular disorders: Secondary | ICD-10-CM

## 2023-11-15 NOTE — Patient Instructions (Signed)
Medication Instructions:  Your physician recommends that you continue on your current medications as directed. Please refer to the Current Medication list given to you today.  *If you need a refill on your cardiac medications before your next appointment, please call your pharmacy*   Testing/Procedures: **[phone]**  Dr. Allyson Sabal has ordered a CT coronary calcium score.   Test locations:  [facility]  [facility]  This is $99 out of pocket.   Coronary CalciumScan A coronary calcium scan is an imaging test used to look for deposits of calcium and other fatty materials (plaques) in the inner lining of the blood vessels of the heart (coronary arteries). These deposits of calcium and plaques can partly clog and narrow the coronary arteries without producing any symptoms or warning signs. This puts a person at risk for a heart attack. This test can detect these deposits before symptoms develop. Tell a health care provider about: Any allergies you have. All medicines you are taking, including vitamins, herbs, eye drops, creams, and over-the-counter medicines. Any problems you or family members have had with anesthetic medicines. Any blood disorders you have. Any surgeries you have had. Any medical conditions you have. Whether you are pregnant or may be pregnant. What are the risks? Generally, this is a safe procedure. However, problems may occur, including: Harm to a pregnant woman and her unborn baby. This test involves the use of radiation. Radiation exposure can be dangerous to a pregnant woman and her unborn baby. If you are pregnant, you generally should not have this procedure done. Slight increase in the risk of cancer. This is because of the radiation involved in the test. What happens before the procedure? No preparation is needed for this procedure. What happens during the  procedure? You will undress and remove any jewelry around your neck or chest. You will put on a hospital gown. Sticky electrodes will be placed on your chest. The electrodes will be connected to an electrocardiogram (ECG) machine to record a tracing of the electrical activity of your heart. A CT scanner will take pictures of your heart. During this time, you will be asked to lie still and hold your breath for 2-3 seconds while a picture of your heart is being taken. The procedure may vary among health care providers and hospitals. What happens after the procedure? You can get dressed. You can return to your normal activities. It is up to you to get the results of your test. Ask your health care provider, or the department that is doing the test, when your results will be ready. Summary A coronary calcium scan is an imaging test used to look for deposits of calcium and other fatty materials (plaques) in the inner lining of the blood vessels of the heart (coronary arteries). Generally, this is a safe procedure. Tell your health care provider if you are pregnant or may be pregnant. No preparation is needed for this procedure. A CT scanner will take pictures of your heart. You can return to your normal activities after the scan is done. This information is not intended to replace advice given to you by your health care provider. Make sure you discuss any questions you have with your health care provider. Document Released: 05/21/2008 Document Revised: 10/12/2016 Document Reviewed: 10/12/2016 Elsevier Interactive Patient Education  2017 [employer].    Follow-Up: At [facility], you and your health needs are our priority.  As part of  our continuing mission to provide you with exceptional heart care, we have created designated Provider Care Teams.  These Care Teams include your primary Cardiologist (physician) and Advanced Practice Providers (APPs -  Physician Assistants and Nurse  Practitioners) who all work together to provide you with the care you need, when you need it.  We recommend signing up for the patient portal called "MyChart".  Sign up information is provided on this After Visit Summary.  MyChart is used to connect with patients for Virtual Visits (Telemedicine).  Patients are able to view lab/test results, encounter notes, upcoming appointments, etc.  Non-urgent messages can be sent to your provider as well.   To learn more about what you can do with MyChart, go to [email or web address].    Your next appointment:   We will see you on an as needed basis.  Provider:   Nanetta Batty, MD

## 2023-11-15 NOTE — Assessment & Plan Note (Signed)
Will obtain a coronary calcium score to further evaluate.

## 2023-11-15 NOTE — Progress Notes (Signed)
11/15/2023 Robin King   10/24/1966  [id number]  Primary Physician Marshia Ly, NP Primary Cardiologist: Runell Gess MD Nicholes Calamity, [place]  HPI:  Robin King is a 57 y.o. 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  She was referred because of coronary calcification seen in the LAD on a screening chest CT.  Her father was also a patient of mine.  Risk factors include 15 pack years of tobacco abuse having quit in 2018.  She has no other cardiac risk factors.  She denies chest pain or shortness of breath.  She does have some orthopedic issues and apparently is in need of a right total knee replacement in future.  She had a screening chest CT performed 09/02/2023 revealing calcium in the LAD distribution.   Current Meds  Medication Sig   ALPRAZolam (XANAX) 1 MG tablet Take 1 mg by mouth 2 (two) times daily as needed.   amphetamine-dextroamphetamine (ADDERALL) 20 MG tablet Take 20 mg by mouth 2 (two) times daily.   buPROPion (WELLBUTRIN XL) 300 MG 24 hr tablet bupropion HCl XL 300 mg 24 hr tablet, extended release   celecoxib (CELEBREX) 200 MG capsule Take 200 mg by mouth 2 (two) times daily.   gabapentin (NEURONTIN) 100 MG capsule 300 mg. 1-2 as needed   HYDROcodone-acetaminophen (NORCO) 10-325 MG tablet Take 1 tablet by mouth 3 (three) times daily as needed.   L-Lysine 1000 MG TABS Take by mouth daily.   methocarbamol (ROBAXIN) 750 MG tablet Take 750 mg by mouth every 8 (eight) hours as needed for muscle spasms.   omeprazole (PRILOSEC) 40 MG capsule omeprazole 40 mg capsule,delayed release   valACYclovir (VALTREX) 500 MG tablet Take 500 mg by mouth daily.     No Known Allergies  Social History   Socioeconomic History   Marital status: Divorced    Spouse name: Not on file   Number of children: 2   Years of education: 16   Highest education level: Not on file   Occupational History    Comment: phlebotomist [employer]  Tobacco Use   Smoking status: Former    Current packs/day: 0.00    Types: Cigarettes    Quit date: 09/22/2017    Years since quitting: 6.1   Smokeless tobacco: Never  Substance and Sexual Activity   Alcohol use: Yes    Comment: 4-6 beers weekly   Drug use: No   Sexual activity: Not on file  Other Topics Concern   Not on file  Social History Narrative   Lives with son   Caffeine- 3 daily, black tea, sodas   Social Determinants of Health   Financial Resource Strain: Not on file  Food Insecurity: Not on file  Transportation Needs: Not on file  Physical Activity: Not on file  Stress: Not on file  Social Connections: Not on file  Intimate Partner Violence: Not on file     Review of Systems: General: negative for chills, fever, night sweats or weight changes.  Cardiovascular: negative for chest pain, dyspnea on exertion, edema, orthopnea, palpitations, paroxysmal nocturnal dyspnea or shortness of breath Dermatological: negative for rash Respiratory: negative for cough or wheezing Urologic: negative for hematuria Abdominal: negative for nausea, vomiting, diarrhea, bright red blood per rectum, melena, or hematemesis Neurologic: negative for visual changes, syncope, or dizziness All other systems reviewed and are otherwise negative except as noted above.  Blood pressure 114/88, pulse 85, height 5\' 6"  (1.676 m), weight 230 lb (104.3 kg), SpO2 98%.  General appearance: alert and no distress Neck: no adenopathy, no carotid bruit, no JVD, supple, symmetrical, trachea midline, and thyroid not enlarged, symmetric, no tenderness/mass/nodules Lungs: clear to auscultation bilaterally Heart: regular rate and rhythm, S1, S2 normal, no murmur, click, rub or gallop Extremities: extremities normal, atraumatic, no cyanosis or edema Pulses: 2+ and symmetric Skin: Skin color, texture, turgor normal. No rashes or lesions Neurologic:  Grossly normal  EKG EKG Interpretation Date/Time:  Monday November 15 2023 11:01:29 EST Ventricular Rate:  97 PR Interval:  142 QRS Duration:  78 QT Interval:  344 QTC Calculation: 436 R Axis:   -16  Text Interpretation: Normal sinus rhythm Possible Left atrial enlargement No previous ECGs available Confirmed by Nanetta Batty [phone]) on 11/15/2023 11:20:34 AM    ASSESSMENT AND PLAN:   Coronary artery calcification seen on CT scan Will obtain a coronary calcium score to further evaluate.     Runell Gess MD FACP,FACC,FAHA, [facility] 11/15/2023 11:29 AM

## 2023-12-16 ENCOUNTER — Encounter: Payer: Self-pay | Admitting: Cardiovascular Disease

## 2023-12-22 ENCOUNTER — Encounter: Payer: Self-pay | Admitting: *Deleted

## 2023-12-27 ENCOUNTER — Other Ambulatory Visit: Payer: Self-pay

## 2023-12-27 ENCOUNTER — Ambulatory Visit
Admission: RE | Admit: 2023-12-27 | Discharge: 2023-12-27 | Disposition: A | Discharge status: Home / Self Care | Payer: Self-pay | Source: Ambulatory Visit | Attending: Cardiovascular Disease | Admitting: Cardiovascular Disease

## 2023-12-27 DIAGNOSIS — I2510 Atherosclerotic heart disease of native coronary artery without angina pectoris: Secondary | ICD-10-CM | POA: Insufficient documentation

## 2023-12-27 DIAGNOSIS — Z136 Encounter for screening for cardiovascular disorders: Secondary | ICD-10-CM | POA: Insufficient documentation

## 2023-12-27 DIAGNOSIS — R931 Abnormal findings on diagnostic imaging of heart and coronary circulation: Secondary | ICD-10-CM

## 2023-12-27 MED ORDER — ATORVASTATIN CALCIUM 20 MG PO TABS: 20.0000 mg | ORAL_TABLET | Freq: Every day | ORAL | 3 refills | Status: DC

## 2024-01-11 NOTE — H&P (Cosign Needed)
Patient's anticipated LOS is less than 2 midnights, meeting these requirements: - Younger than 27 - Lives within 1 hour of care - Has a competent adult at home to recover with post-op recover - NO history of  - Chronic pain requiring opiods  - Diabetes  - Coronary Artery Disease  - Heart failure  - Heart attack  - Stroke  - DVT/VTE  - Cardiac arrhythmia  - Respiratory Failure/COPD  - Renal failure  - Anemia  - Advanced Liver disease     Robin King is an 58 y.o. female.    Chief Complaint: right knee pain  HPI: Pt is a 58 y.o. female complaining of right knee pain for multiple years. Pain had continually increased since the beginning. X-rays in the clinic show end-stage arthritic changes of the right knee. Pt has tried various conservative treatments which have failed to alleviate their symptoms, including injections and therapy. Various options are discussed with the patient. Risks, benefits and expectations were discussed with the patient. Patient understand the risks, benefits and expectations and wishes to proceed with surgery.   PCP:  Selinda Michaels, FNP  D/C Plans: Home  PMH: Past Medical History:  Diagnosis Date   Arthritis    DDD (degenerative disc disease), lumbar    Peripheral neuropathy    Reflux esophagitis     PSH: Past Surgical History:  Procedure Laterality Date   CARPAL TUNNEL RELEASE Right 2010, 09/2017   both   CESAREAN SECTION  1993, 1996   x 2   gall bladder  1994   lap choley   KNEE ARTHROSCOPY Right 1985, 1987    Social History:  reports that she quit smoking about 6 years ago. Her smoking use included cigarettes. She has never used smokeless tobacco. She reports current alcohol use. She reports that she does not use drugs. BMI: Estimated body mass index is 37.12 kg/m as calculated from the following:   Height as of 11/15/23: 5\' 6"  (1.676 m).   Weight as of 11/15/23: 104.3 kg.  Lab Results  Component Value Date   ALBUMIN 4.6  08/04/2023   Diabetes: Patient does not have a diagnosis of diabetes. Lab Results  Component Value Date   HGBA1C 5.7 (H) 08/04/2023     Smoking Status:      Allergies:  No Known Allergies  Medications: No current facility-administered medications for this encounter.   Current Outpatient Medications  Medication Sig Dispense Refill   ALPRAZolam (XANAX) 1 MG tablet Take 1 mg by mouth 2 (two) times daily as needed.     amphetamine-dextroamphetamine (ADDERALL) 20 MG tablet Take 20 mg by mouth 2 (two) times daily.     atorvastatin (LIPITOR) 20 MG tablet Take 1 tablet (20 mg total) by mouth daily. 90 tablet 3   buPROPion (WELLBUTRIN XL) 300 MG 24 hr tablet bupropion HCl XL 300 mg 24 hr tablet, extended release     celecoxib (CELEBREX) 200 MG capsule Take 200 mg by mouth 2 (two) times daily.     HYDROcodone-acetaminophen (NORCO) 10-325 MG tablet Take 1 tablet by mouth 3 (three) times daily as needed.     L-Lysine 1000 MG TABS Take by mouth daily.     methocarbamol (ROBAXIN) 750 MG tablet Take 750 mg by mouth every 8 (eight) hours as needed for muscle spasms.     omeprazole (PRILOSEC) 40 MG capsule omeprazole 40 mg capsule,delayed release     valACYclovir (VALTREX) 500 MG tablet Take 500 mg by mouth daily.  No results found for this or any previous visit (from the past 48 hours). No results found.  ROS: Pain with rom of the right lower extremity  Physical Exam: Alert and oriented 58 y.o. female in no acute distress Cranial nerves 2-12 intact Cervical spine: full rom with no tenderness, nv intact distally Chest: active breath sounds bilaterally, no wheeze rhonchi or rales Heart: regular rate and rhythm, no murmur Abd: non tender non distended with active bowel sounds Hip is stable with rom  Right knee painful rom with crepitus Nv intact distally No rashes or edema  Assessment/Plan Assessment: right knee end stage osteoarthritis  Plan:  Patient will undergo a right  total knee by Dr. Ranell Patrick at [facility] R[facility] benefits and expectations were discussed with the patient. Patient understand risks, benefits and expectations and wishes to proceed. Preoperative templating of the joint replacement has been completed, documented, and submitted to the Operating Room personnel in order to optimize intra-operative equipment management.   Alphonsa Overall PA-C, MPAS [facility] is now [employer] [facility] [employer] [address], [address], [place], [place] [address] Phone: [phone] [email or web address] Facebook  [employer]

## 2024-01-20 NOTE — Progress Notes (Addendum)
COVID Vaccine received:  []  No [x]  Yes Date of any COVID positive Test in last 90 days: no PCP -Hoy Register NP  Cardiologist - Nanetta Batty MD  Chest x-ray - CT chest 08/23/23 Epic EKG -  11/15/23 Epic Stress Test -  ECHO -  Cardiac Cath -   Bowel Prep - [x]  No  []   Yes ______  Pacemaker / ICD device [x]  No []  Yes   Spinal Cord Stimulator:[x]  No []  Yes       History of Sleep Apnea? [x]  No []  Yes   CPAP used?- [x]  No []  Yes    Does the patient monitor blood sugar?          [x]  No []  Yes  []  N/A  Patient has: [x]  NO Hx DM   []  Pre-DM                 []  DM1  []   DM2 Does patient have a [employer] or [employer]? []  No []  Yes   Fasting Blood Sugar Ranges-  Checks Blood Sugar _____ times a day  GLP1 agonist / usual dose - no GLP1 instructions:  SGLT-2 inhibitors / usual dose - no SGLT-2 instructions:   Blood Thinner / Instructions:no Aspirin Instructions:no  Comments:   Activity level: Patient is able to climb a flight of stairs without difficulty; [x]  No CP  [x]  No SOB,    Patient can perform ADLs without assistance.   Anesthesia review: Coronary artery calcification, Aortic atherosclerosis  Patient denies shortness of breath, fever, cough and chest pain at PAT appointment.  Patient verbalized understanding and agreement to the Pre-Surgical Instructions that were given to them at this PAT appointment. Patient was also educated of the need to review these PAT instructions again prior to his/her surgery.I reviewed the appropriate phone numbers to call if they have any and questions or concerns.

## 2024-01-20 NOTE — Patient Instructions (Signed)
SURGICAL WAITING ROOM VISITATION  Patients having surgery or a procedure may have no more than 2 support people in the waiting area - these visitors may rotate.    Children under the age of 98 must have an adult with them who is not the patient.  Due to an increase in RSV and influenza rates and associated hospitalizations, children ages 75 and under may not visit patients in [facility] hospitals.  Visitors with respiratory illnesses are discouraged from visiting and should remain at home.  If the patient needs to stay at the hospital during part of their recovery, the visitor guidelines for inpatient rooms apply. Pre-op nurse will coordinate an appropriate time for 1 support person to accompany patient in pre-op.  This support person may not rotate.    Please refer to the [facility] website for the visitor guidelines for Inpatients (after your surgery is over and you are in a regular room).       Your procedure is scheduled on: 02/04/24   Report to [facility] Main Entrance    Report to admitting at 10:40 AM   Call this number if you have problems the morning of surgery [phone]   Do not eat food :After Midnight.   After Midnight you may have the following liquids until 10:10 AM DAY OF SURGERY  Water Non-Citrus Juices (without pulp, NO RED-Apple, White grape, White cranberry) Black Coffee (NO MILK/CREAM OR CREAMERS, sugar ok)  Clear Tea (NO MILK/CREAM OR CREAMERS, sugar ok) regular and decaf                             Plain Jell-O (NO RED)                                           Fruit ices (not with fruit pulp, NO RED)                                     Popsicles (NO RED)                                                               Sports drinks like Gatorade (NO RED)                  The day of surgery:  Drink ONE (1) Pre-Surgery Clear Ensure  at 10:10 AM the morning of surgery. Drink in one sitting. Do not sip.  This drink was given to you during your  hospital  pre-op appointment visit. Nothing else to drink after completing the  Pre-Surgery Clear Ensure.      Oral Hygiene is also important to reduce your risk of infection.                                    Remember - BRUSH YOUR TEETH THE MORNING OF SURGERY WITH YOUR REGULAR TOOTHPASTE   Stop all vitamins and herbal supplements 7 days before surgery.   Take these medicines the  morning of surgery with A SIP OF WATER: Xanax if needed, Gabapentin, Omeprazole, Norco if needed.             You may not have any metal on your body including hair pins, jewelry, and body piercing             Do not wear make-up, lotions, powders, perfumes/cologne, or deodorant  Do not wear nail polish including gel and S&S, artificial/acrylic nails, or any other type of covering on natural nails including finger and toenails. If you have artificial nails, gel coating, etc. that needs to be removed by a nail salon please have this removed prior to surgery or surgery may need to be canceled/ delayed if the surgeon/ anesthesia feels like they are unable to be safely monitored.   Do not shave  48 hours prior to surgery.    Do not bring valuables to the hospital. [facility] IS NOT             RESPONSIBLE   FOR VALUABLES.   Contacts, glasses, dentures or bridgework may not be worn into surgery.   Bring small overnight bag day of surgery.   DO NOT BRING YOUR HOME MEDICATIONS TO THE HOSPITAL. PHARMACY WILL DISPENSE MEDICATIONS LISTED ON YOUR MEDICATION LIST TO YOU DURING YOUR ADMISSION IN THE HOSPITAL!    Patients discharged on the day of surgery will not be allowed to drive home.  Someone NEEDS to stay with you for the first 24 hours after anesthesia.   Special Instructions: Bring a copy of your healthcare power of attorney and living will documents the day of surgery if you haven't scanned them before.              Please read over the following fact sheets you were given: IF YOU HAVE QUESTIONS ABOUT YOUR  PRE-OP INSTRUCTIONS PLEASE CALL [phone] [place]   If you received a COVID test during your pre-op visit  it is requested that you wear a mask when out in public, stay away from anyone that may not be feeling well and notify your surgeon if you develop symptoms. If you test positive for Covid or have been in contact with anyone that has tested positive in the last 10 days please notify you surgeon.      Pre-operative 5 CHG Bath Instructions   You can play a key role in reducing the risk of infection after surgery. Your skin needs to be as free of germs as possible. You can reduce the number of germs on your skin by washing with CHG (chlorhexidine gluconate) soap before surgery. CHG is an antiseptic soap that kills germs and continues to kill germs even after washing.   DO NOT use if you have an allergy to chlorhexidine/CHG or antibacterial soaps. If your skin becomes reddened or irritated, stop using the CHG and notify one of our RNs at [phone].   Please shower with the CHG soap starting 4 days before surgery using the following schedule:     Please keep in mind the following:  DO NOT shave, including legs and underarms, starting the day of your first shower.   You may shave your face at any point before/day of surgery.  Place clean sheets on your bed the day you start using CHG soap. Use a clean washcloth (not used since being washed) for each shower. DO NOT sleep with pets once you start using the CHG.   CHG Shower Instructions:  If you choose to wash your  hair and private area, wash first with your normal shampoo/soap.  After you use shampoo/soap, rinse your hair and body thoroughly to remove shampoo/soap residue.  Turn the water OFF and apply about 3 tablespoons (45 ml) of CHG soap to a CLEAN washcloth.  Apply CHG soap ONLY FROM YOUR NECK DOWN TO YOUR TOES (washing for 3-5 minutes)  DO NOT use CHG soap on face, private areas, open wounds, or sores.  Pay special attention to  the area where your surgery is being performed.  If you are having back surgery, having someone wash your back for you may be helpful. Wait 2 minutes after CHG soap is applied, then you may rinse off the CHG soap.  Pat dry with a clean towel  Put on clean clothes/pajamas   If you choose to wear lotion, please use ONLY the CHG-compatible lotions on the back of this paper.     Additional instructions for the day of surgery: DO NOT APPLY any lotions, deodorants, cologne, or perfumes.   Put on clean/comfortable clothes.  Brush your teeth.  Ask your nurse before applying any prescription medications to the skin.      CHG Compatible Lotions   Aveeno Moisturizing lotion  Cetaphil Moisturizing Cream  Cetaphil Moisturizing Lotion  Clairol Herbal Essence Moisturizing Lotion, Dry Skin  Clairol Herbal Essence Moisturizing Lotion, Extra Dry Skin  Clairol Herbal Essence Moisturizing Lotion, Normal Skin  Curel Age Defying Therapeutic Moisturizing Lotion with Alpha Hydroxy  Curel Extreme Care Body Lotion  Curel Soothing Hands Moisturizing Hand Lotion  Curel Therapeutic Moisturizing Cream, Fragrance-Free  Curel Therapeutic Moisturizing Lotion, Fragrance-Free  Curel Therapeutic Moisturizing Lotion, Original Formula  Eucerin Daily Replenishing Lotion  Eucerin Dry Skin Therapy Plus Alpha Hydroxy Crme  Eucerin Dry Skin Therapy Plus Alpha Hydroxy Lotion  Eucerin Original Crme  Eucerin Original Lotion  Eucerin Plus Crme Eucerin Plus Lotion  Eucerin TriLipid Replenishing Lotion  Keri Anti-Bacterial Hand Lotion  Keri Deep Conditioning Original Lotion Dry Skin Formula Softly Scented  Keri Deep Conditioning Original Lotion, Fragrance Free Sensitive Skin Formula  Keri Lotion Fast Absorbing Fragrance Free Sensitive Skin Formula  Keri Lotion Fast Absorbing Softly Scented Dry Skin Formula  Keri Original Lotion  Keri Skin Renewal Lotion Keri Silky Smooth Lotion  Keri Silky Smooth Sensitive Skin  Lotion  Nivea Body Creamy Conditioning Oil  Nivea Body Extra Enriched Lotion  Nivea Body Original Lotion  Nivea Body Sheer Moisturizing Lotion Nivea Crme  Nivea Skin Firming Lotion  NutraDerm 30 Skin Lotion  NutraDerm Skin Lotion  NutraDerm Therapeutic Skin Cream  NutraDerm Therapeutic Skin Lotion  ProShield Protective Hand Cream    Incentive Spirometer (Watch this video at home: [email or web address])  An incentive spirometer is a tool that can help keep your lungs clear and active. This tool measures how well you are filling your lungs with each breath. Taking long deep breaths may help reverse or decrease the chance of developing breathing (pulmonary) problems (especially infection) following: A long period of time when you are unable to move or be active. BEFORE THE PROCEDURE  If the spirometer includes an indicator to show your best effort, your nurse or respiratory therapist will set it to a desired goal. If possible, sit up straight or lean slightly forward. Try not to slouch. Hold the incentive spirometer in an upright position. INSTRUCTIONS FOR USE  Sit on the edge of your bed if possible, or sit up as far as you can in bed or on a chair. Hold the incentive spirometer  in an upright position. Breathe out normally. Place the mouthpiece in your mouth and seal your lips tightly around it. Breathe in slowly and as deeply as possible, raising the piston or the ball toward the top of the column. Hold your breath for 3-5 seconds or for as long as possible. Allow the piston or ball to fall to the bottom of the column. Remove the mouthpiece from your mouth and breathe out normally. Rest for a few seconds and repeat Steps 1 through 7 at least 10 times every 1-2 hours when you are awake. Take your time and take a few normal breaths between deep breaths. The spirometer may include an indicator to show your best effort. Use the indicator as a goal to work toward  during each repetition. After each set of 10 deep breaths, practice coughing to be sure your lungs are clear. If you have an incision (the cut made at the time of surgery), support your incision when coughing by placing a pillow or rolled up towels firmly against it. Once you are able to get out of bed, walk around indoors and cough well. You may stop using the incentive spirometer when instructed by your caregiver.  RISKS AND COMPLICATIONS Take your time so you do not get dizzy or light-headed. If you are in pain, you may need to take or ask for pain medication before doing incentive spirometry. It is harder to take a deep breath if you are having pain. AFTER USE Rest and breathe slowly and easily. It can be helpful to keep track of a log of your progress. Your caregiver can provide you with a simple table to help with this. If you are using the spirometer at home, follow these instructions: SEEK MEDICAL CARE IF:  You are having difficultly using the spirometer. You have trouble using the spirometer as often as instructed. Your pain medication is not giving enough relief while using the spirometer. You develop fever of 100.5 F (38.1 C) or higher. SEEK IMMEDIATE MEDICAL CARE IF:  You cough up bloody sputum that had not been present before. You develop fever of 102 F (38.9 C) or greater. You develop worsening pain at or near the incision site. MAKE SURE YOU:  Understand these instructions. Will watch your condition. Will get help right away if you are not doing well or get worse. Document Released: 04/05/2007 Document Revised: 02/15/2012 Document Reviewed: 06/06/2007 [facility] Patient Information 2014 [place], [place].

## 2024-01-24 ENCOUNTER — Encounter (HOSPITAL_COMMUNITY): Payer: Self-pay

## 2024-01-24 ENCOUNTER — Other Ambulatory Visit: Payer: Self-pay

## 2024-01-24 ENCOUNTER — Encounter (HOSPITAL_COMMUNITY)
Admission: RE | Admit: 2024-01-24 | Discharge: 2024-01-24 | Disposition: A | Discharge status: Home / Self Care | Payer: Managed Care, Other (non HMO) | Source: Ambulatory Visit | Attending: Orthopedic Surgery

## 2024-01-24 VITALS — BP 119/80 | HR 78 | Temp 98.3°F | Resp 16 | Ht 66.0 in | Wt 235.0 lb

## 2024-01-24 DIAGNOSIS — Z01818 Encounter for other preprocedural examination: Secondary | ICD-10-CM

## 2024-01-24 DIAGNOSIS — Z01812 Encounter for preprocedural laboratory examination: Secondary | ICD-10-CM | POA: Insufficient documentation

## 2024-01-24 HISTORY — DX: Anxiety disorder, unspecified: F41.9

## 2024-01-24 HISTORY — DX: Other complications of anesthesia, initial encounter: T88.59XA

## 2024-01-24 HISTORY — DX: Depression, unspecified: F32.A

## 2024-01-24 LAB — CBC
HCT: 43.1 % (ref 36.0–46.0)
Hemoglobin: 14.1 g/dL (ref 12.0–15.0)
MCH: 32.6 pg (ref 26.0–34.0)
MCHC: 32.7 g/dL (ref 30.0–36.0)
MCV: 99.8 fL (ref 80.0–100.0)
Platelets: 301 10*3/uL (ref 150–400)
RBC: 4.32 MIL/uL (ref 3.87–5.11)
RDW: 12.8 % (ref 11.5–15.5)
WBC: 9.8 10*3/uL (ref 4.0–10.5)
nRBC: 0 % (ref 0.0–0.2)

## 2024-01-24 LAB — BASIC METABOLIC PANEL
Anion gap: 12 (ref 5–15)
BUN: 13 mg/dL (ref 6–20)
CO2: 25 mmol/L (ref 22–32)
Calcium: 9.4 mg/dL (ref 8.9–10.3)
Chloride: 103 mmol/L (ref 98–111)
Creatinine, Ser: 0.79 mg/dL (ref 0.44–1.00)
GFR, Estimated: >60 mL/min (ref >60)
Glucose, Bld: 109 mg/dL — ABNORMAL HIGH (ref 70–99)
Potassium: 4.6 mmol/L (ref 3.5–5.1)
Sodium: 140 mmol/L (ref 135–145)

## 2024-01-24 LAB — SURGICAL PCR SCREEN
MRSA, PCR: NEGATIVE
Staphylococcus aureus: POSITIVE — AB

## 2024-01-24 NOTE — Progress Notes (Signed)
Please review pre op PCR result from 01/24/24.

## 2024-02-02 ENCOUNTER — Encounter: Payer: Self-pay | Admitting: Cardiovascular Disease

## 2024-02-02 MED ORDER — ATORVASTATIN CALCIUM 20 MG PO TABS: 20.0000 mg | ORAL_TABLET | Freq: Every evening | ORAL | 3 refills | Status: DC

## 2024-02-04 ENCOUNTER — Ambulatory Visit (HOSPITAL_COMMUNITY)
Admission: RE | Admit: 2024-02-04 | Discharge: 2024-02-04 | Disposition: A | Discharge status: Home / Self Care | Payer: Managed Care, Other (non HMO) | Source: Ambulatory Visit | Attending: Orthopedic Surgery | Admitting: Orthopedic Surgery

## 2024-02-04 ENCOUNTER — Encounter (HOSPITAL_COMMUNITY): Payer: Self-pay | Admitting: Orthopedic Surgery

## 2024-02-04 ENCOUNTER — Ambulatory Visit (HOSPITAL_COMMUNITY): Payer: Self-pay | Admitting: Anesthesiology

## 2024-02-04 ENCOUNTER — Encounter (HOSPITAL_COMMUNITY)
Admission: RE | Disposition: A | Discharge status: Home / Self Care | Payer: Self-pay | Source: Ambulatory Visit | Attending: Orthopedic Surgery

## 2024-02-04 ENCOUNTER — Other Ambulatory Visit: Payer: Self-pay

## 2024-02-04 ENCOUNTER — Ambulatory Visit (HOSPITAL_COMMUNITY): Payer: Managed Care, Other (non HMO) | Admitting: Physician Assistant

## 2024-02-04 DIAGNOSIS — M25761 Osteophyte, right knee: Secondary | ICD-10-CM | POA: Diagnosis not present

## 2024-02-04 DIAGNOSIS — F32A Depression, unspecified: Secondary | ICD-10-CM | POA: Diagnosis not present

## 2024-02-04 DIAGNOSIS — Z87891 Personal history of nicotine dependence: Secondary | ICD-10-CM | POA: Insufficient documentation

## 2024-02-04 DIAGNOSIS — I2510 Atherosclerotic heart disease of native coronary artery without angina pectoris: Secondary | ICD-10-CM | POA: Insufficient documentation

## 2024-02-04 DIAGNOSIS — K219 Gastro-esophageal reflux disease without esophagitis: Secondary | ICD-10-CM | POA: Diagnosis not present

## 2024-02-04 DIAGNOSIS — M1711 Unilateral primary osteoarthritis, right knee: Secondary | ICD-10-CM

## 2024-02-04 DIAGNOSIS — F419 Anxiety disorder, unspecified: Secondary | ICD-10-CM | POA: Insufficient documentation

## 2024-02-04 HISTORY — PX: TOTAL KNEE ARTHROPLASTY: SHX125

## 2024-02-04 SURGERY — ARTHROPLASTY, KNEE, TOTAL
Anesthesia: Spinal | Site: Knee | Laterality: Right

## 2024-02-04 MED ORDER — SCOPOLAMINE 1 MG/3DAYS TD PT72
1.0000 | MEDICATED_PATCH | Freq: Once | TRANSDERMAL | Status: DC
  Administered 2024-02-04: 1.5 mg via TRANSDERMAL
  Filled 2024-02-04: qty 1

## 2024-02-04 MED ORDER — CHLORHEXIDINE GLUCONATE 0.12 % MT SOLN
15.0000 mL | Freq: Once | OROMUCOSAL | Status: AC
  Administered 2024-02-04: 15 mL via OROMUCOSAL

## 2024-02-04 MED ORDER — SODIUM CHLORIDE 0.9 % IR SOLN
Status: DC | PRN
  Administered 2024-02-04: 1000 mL

## 2024-02-04 MED ORDER — HYDROMORPHONE HCL 1 MG/ML IJ SOLN: 0.5000 mg | INTRAMUSCULAR | Status: DC | PRN

## 2024-02-04 MED ORDER — SODIUM CHLORIDE 0.9 % IV SOLN
INTRAVENOUS | Status: DC | PRN
  Administered 2024-02-04: 80 mL

## 2024-02-04 MED ORDER — ROPIVACAINE HCL 5 MG/ML IJ SOLN
INTRAMUSCULAR | Status: DC | PRN
Start: 2024-02-04 — End: 2024-02-04
  Administered 2024-02-04: 20 mL via PERINEURAL

## 2024-02-04 MED ORDER — MUPIROCIN 2 % EX OINT: 1.0000 | TOPICAL_OINTMENT | Freq: Two times a day (BID) | CUTANEOUS | 0 refills | Status: AC

## 2024-02-04 MED ORDER — MIDAZOLAM HCL 2 MG/2ML IJ SOLN
1.0000 mg | INTRAMUSCULAR | Status: DC
  Administered 2024-02-04: 2 mg via INTRAVENOUS
  Filled 2024-02-04: qty 2

## 2024-02-04 MED ORDER — FENTANYL CITRATE PF 50 MCG/ML IJ SOSY: 25.0000 ug | PREFILLED_SYRINGE | INTRAMUSCULAR | Status: DC | PRN

## 2024-02-04 MED ORDER — LIDOCAINE HCL (PF) 2 % IJ SOLN
INTRAMUSCULAR | Status: AC
  Filled 2024-02-04: qty 5

## 2024-02-04 MED ORDER — CEFAZOLIN SODIUM-DEXTROSE 2-4 GM/100ML-% IV SOLN
2.0000 g | INTRAVENOUS | Status: AC
  Administered 2024-02-04: 2 g via INTRAVENOUS
  Filled 2024-02-04: qty 100

## 2024-02-04 MED ORDER — ONDANSETRON HCL 4 MG/2ML IJ SOLN
INTRAMUSCULAR | Status: DC | PRN
  Administered 2024-02-04: 4 mg via INTRAVENOUS

## 2024-02-04 MED ORDER — POVIDONE-IODINE 10 % EX SWAB: 2.0000 | Freq: Once | CUTANEOUS | Status: DC

## 2024-02-04 MED ORDER — SODIUM CHLORIDE (PF) 0.9 % IJ SOLN
INTRAMUSCULAR | Status: AC
  Filled 2024-02-04: qty 10

## 2024-02-04 MED ORDER — AMISULPRIDE (ANTIEMETIC) 5 MG/2ML IV SOLN: 10.0000 mg | Freq: Once | INTRAVENOUS | Status: DC | PRN

## 2024-02-04 MED ORDER — ONDANSETRON HCL 4 MG PO TABS: 4.0000 mg | ORAL_TABLET | Freq: Three times a day (TID) | ORAL | 1 refills | Status: DC | PRN

## 2024-02-04 MED ORDER — FENTANYL CITRATE PF 50 MCG/ML IJ SOSY
50.0000 ug | PREFILLED_SYRINGE | INTRAMUSCULAR | Status: DC
  Administered 2024-02-04: 50 ug via INTRAVENOUS
  Filled 2024-02-04: qty 2

## 2024-02-04 MED ORDER — PROPOFOL 1000 MG/100ML IV EMUL
INTRAVENOUS | Status: AC
  Filled 2024-02-04: qty 100

## 2024-02-04 MED ORDER — ONDANSETRON HCL 4 MG/2ML IJ SOLN: 4.0000 mg | Freq: Once | INTRAMUSCULAR | Status: DC | PRN

## 2024-02-04 MED ORDER — ONDANSETRON HCL 4 MG/2ML IJ SOLN
INTRAMUSCULAR | Status: AC
  Filled 2024-02-04: qty 2

## 2024-02-04 MED ORDER — CHLORHEXIDINE GLUCONATE 4 % EX SOLN: 1.0000 | CUTANEOUS | 1 refills | Status: DC

## 2024-02-04 MED ORDER — ACETAMINOPHEN 500 MG PO TABS
1000.0000 mg | ORAL_TABLET | Freq: Once | ORAL | Status: AC
  Administered 2024-02-04: 1000 mg via ORAL
  Filled 2024-02-04: qty 2

## 2024-02-04 MED ORDER — METOCLOPRAMIDE HCL 5 MG PO TABS: 5.0000 mg | ORAL_TABLET | Freq: Three times a day (TID) | ORAL | Status: DC | PRN

## 2024-02-04 MED ORDER — DEXAMETHASONE SODIUM PHOSPHATE 10 MG/ML IJ SOLN
INTRAMUSCULAR | Status: DC | PRN
  Administered 2024-02-04: 8 mg via INTRAVENOUS

## 2024-02-04 MED ORDER — ONDANSETRON HCL 4 MG/2ML IJ SOLN: 4.0000 mg | Freq: Four times a day (QID) | INTRAMUSCULAR | Status: DC | PRN

## 2024-02-04 MED ORDER — BUPIVACAINE-EPINEPHRINE (PF) 0.25% -1:200000 IJ SOLN
INTRAMUSCULAR | Status: AC
  Filled 2024-02-04: qty 30

## 2024-02-04 MED ORDER — 0.9 % SODIUM CHLORIDE (POUR BTL) OPTIME
TOPICAL | Status: DC | PRN
  Administered 2024-02-04: 1000 mL

## 2024-02-04 MED ORDER — METOCLOPRAMIDE HCL 5 MG/ML IJ SOLN: 5.0000 mg | Freq: Three times a day (TID) | INTRAMUSCULAR | Status: DC | PRN

## 2024-02-04 MED ORDER — TRANEXAMIC ACID-NACL 1000-0.7 MG/100ML-% IV SOLN: 1000.0000 mg | Freq: Once | INTRAVENOUS | Status: DC

## 2024-02-04 MED ORDER — PROPOFOL 10 MG/ML IV BOLUS
INTRAVENOUS | Status: AC
  Filled 2024-02-04: qty 20

## 2024-02-04 MED ORDER — LIDOCAINE 2% (20 MG/ML) 5 ML SYRINGE
INTRAMUSCULAR | Status: DC | PRN
  Administered 2024-02-04: 50 mg via INTRAVENOUS

## 2024-02-04 MED ORDER — DEXAMETHASONE SODIUM PHOSPHATE 10 MG/ML IJ SOLN
INTRAMUSCULAR | Status: AC
  Filled 2024-02-04: qty 1

## 2024-02-04 MED ORDER — PROPOFOL 500 MG/50ML IV EMUL
INTRAVENOUS | Status: AC
  Filled 2024-02-04: qty 50

## 2024-02-04 MED ORDER — TRANEXAMIC ACID-NACL 1000-0.7 MG/100ML-% IV SOLN
1000.0000 mg | INTRAVENOUS | Status: AC
  Administered 2024-02-04: 1000 mg via INTRAVENOUS
  Filled 2024-02-04: qty 100

## 2024-02-04 MED ORDER — CLONIDINE HCL (ANALGESIA) 100 MCG/ML EP SOLN
EPIDURAL | Status: DC | PRN
  Administered 2024-02-04: 50 ug

## 2024-02-04 MED ORDER — OXYCODONE HCL 5 MG PO TABS: 5.0000 mg | ORAL_TABLET | ORAL | Status: DC | PRN

## 2024-02-04 MED ORDER — OXYCODONE-ACETAMINOPHEN 5-325 MG PO TABS: 1.0000 | ORAL_TABLET | ORAL | 0 refills | Status: DC | PRN

## 2024-02-04 MED ORDER — ORAL CARE MOUTH RINSE: 15.0000 mL | Freq: Once | OROMUCOSAL | Status: AC

## 2024-02-04 MED ORDER — PROPOFOL 10 MG/ML IV BOLUS
INTRAVENOUS | Status: DC | PRN
  Administered 2024-02-04: 75 ug/kg/min via INTRAVENOUS
  Administered 2024-02-04: 60 mg via INTRAVENOUS
  Administered 2024-02-04: 20 mg via INTRAVENOUS

## 2024-02-04 MED ORDER — BUPIVACAINE LIPOSOME 1.3 % IJ SUSP: 20.0000 mL | Freq: Once | INTRAMUSCULAR | Status: DC

## 2024-02-04 MED ORDER — ASPIRIN 81 MG PO CHEW
81.0000 mg | CHEWABLE_TABLET | Freq: Two times a day (BID) | ORAL | 0 refills | Status: AC
Start: 2024-02-04 — End: 2024-03-05

## 2024-02-04 MED ORDER — ONDANSETRON HCL 4 MG PO TABS: 4.0000 mg | ORAL_TABLET | Freq: Four times a day (QID) | ORAL | Status: DC | PRN

## 2024-02-04 MED ORDER — LACTATED RINGERS IV SOLN: INTRAVENOUS | Status: DC

## 2024-02-04 SURGICAL SUPPLY — 46 items
ATTUNE PSFEM RTSZ6 NARCEM KNEE (Femur) IMPLANT
ATTUNE PSRP INSR SZ6 6 KNEE (Insert) IMPLANT
BAG COUNTER SPONGE SURGICOUNT (BAG) IMPLANT
BAG ZIPLOCK 12X15 (MISCELLANEOUS) IMPLANT
BASE TIBIAL ROT PLAT SZ 5 KNEE (Knees) IMPLANT
BLADE SAG 18X100X1.27 (BLADE) ×1 IMPLANT
BLADE SAW SGTL 13X75X1.27 (BLADE) ×1 IMPLANT
BNDG ELASTIC 6X10 VLCR STRL LF (GAUZE/BANDAGES/DRESSINGS) ×1 IMPLANT
BNDG GAUZE DERMACEA FLUFF 4 (GAUZE/BANDAGES/DRESSINGS) ×1 IMPLANT
BOWL SMART MIX CTS (DISPOSABLE) ×1 IMPLANT
CEMENT HV SMART SET (Cement) ×2 IMPLANT
COVER SURGICAL LIGHT HANDLE (MISCELLANEOUS) ×1 IMPLANT
CUFF TRNQT CYL 34X4.125X (TOURNIQUET CUFF) ×1 IMPLANT
DRAPE INCISE IOBAN 66X45 STRL (DRAPES) IMPLANT
DRAPE SHEET LG 3/4 BI-LAMINATE (DRAPES) ×1 IMPLANT
DRAPE U-SHAPE 47X51 STRL (DRAPES) ×1 IMPLANT
DRSG ADAPTIC 3X8 NADH LF (GAUZE/BANDAGES/DRESSINGS) ×1 IMPLANT
DRSG AQUACEL AG ADV 3.5X10 (GAUZE/BANDAGES/DRESSINGS) IMPLANT
DURAPREP 26ML APPLICATOR (WOUND CARE) ×1 IMPLANT
ELECT REM PT RETURN 15FT ADLT (MISCELLANEOUS) ×1 IMPLANT
GAUZE PAD ABD 8X10 STRL (GAUZE/BANDAGES/DRESSINGS) ×1 IMPLANT
GAUZE SPONGE 4X4 12PLY STRL (GAUZE/BANDAGES/DRESSINGS) ×1 IMPLANT
GLOVE BIOGEL PI IND STRL 7.5 (GLOVE) ×1 IMPLANT
GLOVE BIOGEL PI IND STRL 8.5 (GLOVE) ×1 IMPLANT
GLOVE ORTHO TXT STRL SZ7.5 (GLOVE) ×1 IMPLANT
GLOVE SURG ORTHO 8.5 STRL (GLOVE) ×1 IMPLANT
GOWN STRL REUS W/ TWL XL LVL3 (GOWN DISPOSABLE) ×2 IMPLANT
IMMOBILIZER KNEE 20 (SOFTGOODS) ×1 IMPLANT
IMMOBILIZER KNEE 20 THIGH 36 (SOFTGOODS) ×1 IMPLANT
KIT TURNOVER KIT A (KITS) IMPLANT
MANIFOLD NEPTUNE II (INSTRUMENTS) ×1 IMPLANT
NS IRRIG 1000ML POUR BTL (IV SOLUTION) ×1 IMPLANT
PACK TOTAL KNEE CUSTOM (KITS) ×1 IMPLANT
PATELLA MEDIAL ATTUN 35MM KNEE (Knees) IMPLANT
PIN STEINMAN FIXATION KNEE (PIN) IMPLANT
PROTECTOR NERVE ULNAR (MISCELLANEOUS) ×1 IMPLANT
SET HNDPC FAN SPRY TIP SCT (DISPOSABLE) ×1 IMPLANT
STRIP CLOSURE SKIN 1/2X4 (GAUZE/BANDAGES/DRESSINGS) ×2 IMPLANT
SUT MNCRL AB 3-0 PS2 18 (SUTURE) ×1 IMPLANT
SUT VIC AB 0 CT1 36 (SUTURE) ×1 IMPLANT
SUT VIC AB 1 CT1 36 (SUTURE) ×2 IMPLANT
SUT VIC AB 2-0 CT1 TAPERPNT 27 (SUTURE) ×2 IMPLANT
TIBIAL BASE ROT PLAT SZ 5 KNEE (Knees) ×1 IMPLANT
TRAY CATH INTERMITTENT SS 16FR (CATHETERS) ×1 IMPLANT
WATER STERILE IRR 1000ML POUR (IV SOLUTION) ×2 IMPLANT
YANKAUER SUCT BULB TIP NO VENT (SUCTIONS) ×1 IMPLANT

## 2024-02-04 NOTE — Progress Notes (Signed)
 Orthopedic Tech Progress Note Patient Details:  Robin King 12-30-1965 [id number]  Ortho Devices Type of Ortho Device: Bone foam zero knee Ortho Device/Splint Interventions: Robin King Robin King 02/04/2024, 6:39 PM

## 2024-02-04 NOTE — Anesthesia Postprocedure Evaluation (Signed)
 Anesthesia Post Note  Patient: Robin King  Procedure(s) Performed: TOTAL KNEE ARTHROPLASTY (Right: Knee)     Patient location during evaluation: PACU Anesthesia Type: Spinal Level of consciousness: awake, awake and alert and oriented Pain management: pain level controlled Vital Signs Assessment: post-procedure vital signs reviewed and stable Respiratory status: spontaneous breathing, nonlabored ventilation and respiratory function stable Cardiovascular status: blood pressure returned to baseline and stable Postop Assessment: no headache, no backache, spinal receding and no apparent nausea or vomiting Anesthetic complications: no   No notable events documented.  Last Vitals:  Vitals:   02/04/24 1228 02/04/24 1515  BP: 116/65   Pulse: 73   Resp: 18   Temp:  (P) 36.5 C  SpO2: 100%     Last Pain:  Vitals:   02/04/24 1515  TempSrc:   PainSc: (P) 0-No pain                 Collene Schlichter

## 2024-02-04 NOTE — Transfer of Care (Signed)
 Immediate Anesthesia Transfer of Care Note  Patient: Robin King  Procedure(s) Performed: TOTAL KNEE ARTHROPLASTY (Right: Knee)  Patient Location: PACU  Anesthesia Type:Regional and Spinal  Level of Consciousness: awake, alert , and oriented  Airway & Oxygen Therapy: Patient Spontanous Breathing and Patient connected to face mask oxygen  Post-op Assessment: Report given to RN and Post -op Vital signs reviewed and stable  Post vital signs: Reviewed and stable  Last Vitals:  Vitals Value Taken Time  BP 131/85 02/04/24 1516  Temp    Pulse 85 02/04/24 1518  Resp 16 02/04/24 1518  SpO2 99 % 02/04/24 1518  Vitals shown include unfiled device data.  Last Pain:  Vitals:   02/04/24 1228  TempSrc:   PainSc: 0-No pain         Complications: No notable events documented.

## 2024-02-04 NOTE — Op Note (Signed)
 NAME: Robin King, RED MEDICAL RECORD NO: [id number] ACCOUNT NO: [id number] DATE OF BIRTH: Oct 13, 1966 FACILITY: [facility] LOCATION: [facility] PHYSICIAN: Almedia Balls. Ranell Patrick, MD  Operative Report   DATE OF PROCEDURE: 02/04/2024   PREOPERATIVE DIAGNOSIS: Right knee end-stage osteoarthritis.  POSTOPERATIVE DIAGNOSIS: Right knee end-stage osteoarthritis.  PROCEDURE PERFORMED: Right total knee arthroplasty using DePuy Attune prosthesis.  ATTENDING SURGEON: Almedia Balls. Ranell Patrick, MD  ASSISTANT: Konrad Felix Dixon, [place], who was scrubbed during the entire procedure, and necessary for satisfactory completion of surgery.  ANESTHESIA: Spinal anesthesia plus adductor canal block was utilized.  ESTIMATED BLOOD LOSS: Minimal.  FLUID REPLACEMENT: 1000 mL of crystalloid  COUNTS: Instrument counts correct.  COMPLICATIONS: None.  ANTIBIOTICS: Perioperative antibiotics were given.  TOURNIQUET TIME: 82 minutes at 300 mmHg  INDICATIONS: The patient is a 58 year old female who presents with worsening right knee pain due to end-stage OA, bone-on-bone. The patient has failed conservative management, desires operative treatment to eliminate pain and restore function. Informed  consent was obtained.  DESCRIPTION OF PROCEDURE: After an adequate level of anesthesia was achieved, the patient was positioned in the supine position. Right leg correctly identified and a nonsterile tourniquet was placed on the proximal thigh. Right leg was sterilely prepped  and draped in the usual manner. Timeout called verifying correct patient and correct site. We elevated the leg and exsanguinated using an Esmarch bandage. We inflated the tourniquet to 300 mmHg. We then placed the knee in flexion. We performed a  longitudinal midline incision with a #10 blade scalpel dissection down through the subcutaneous tissues using the #10 blade. We used a fresh 10-blade scalpel for the medial parapatellar arthrotomy. We divided lateral  patellofemoral ligaments everting the  patella and delivering the femur so we could see that easily after the patella was everted. There was full-thickness wear on the distal femur. We entered the distal femur with a step-cut drill. We then placed our intramedullary guide, which we could  only get about halfway down due to the patient has an AML hip stem. Referencing to go off that intramedullary axis, we cut the femur 9 mm off the distal femur, 5 degrees of valgus. We then sized the femur to size 6, anterior down, performing anterior,  posterior, and shaver for cuts with a 4:1 block. We then removed ACL/PCL meniscal tissue, subluxing the tibia anteriorly. Once we had the tibia exposed, we were able to cut the tibia with an external jig cutting 2 mm off the affected medial side with the  oscillating saw with minimal posterior slope for this posterior cruciate substituting prosthesis. With the tibia cut done, we used a laminar spreader and removed excess posterior femoral condyle osteophytes. We then injected the posterior capsule with a  combination of Marcaine, Exparel and saline. Next, we checked our gaps, which were symmetric at 6 mm. We finished our tibial preparation for the size 5 tibia, externally rotating the component as much as possible for patellar tracking purposes. We then  went to the femur and cut the box for the #6 right narrow femur. We then trialed with a 6-mm poly and placed the knee in extension. We were able to get to full extension and had good flexion and stability with the 6 poly in place. We next resurfaced the  patella going from 24 mm thickness down to 14 mm thickness. We drilled lug holes for the 35 patellar button. We arranged the knee and had a little bit of lateral maltracking. Thus, we released the lateral retinaculum and had  improved tracking  significantly. We had excellent patellar tracking with no-touch technique after the lateral release. We removed all trial components,  irrigated thoroughly. We vacuum mixed high viscosity cement on the back table and dried the bone well. We cemented the  components into place including the tibia, femur, and the patella all in one step. We placed the knee in extension with a 6-mm poly and held the knee in extension with good compression on the cement until the cement set up. We removed excess cement with  a quarter inch curved osteotome. We went ahead and selected the real size 6 mm poly, placed on the tibia, and then reduced the knee. We arranged the knee and had excellent patellar tracking. We irrigated thoroughly and then repaired the parapatellar  arthrotomy with #1 Vicryl suture followed by 2-0 Vicryl for subcutaneous closure and 4-0 running Monocryl for skin. Steri-Strips were applied followed by a sterile dressing. The patient tolerated the surgery well.     SUJ D: 02/04/2024 3:11:33 pm T: 02/04/2024 8:52:00 pm  JOB: [id number]/ [id number]

## 2024-02-04 NOTE — Anesthesia Procedure Notes (Signed)
 Anesthesia Regional Block: Adductor canal block   Pre-Anesthetic Checklist: , timeout performed,  Correct Patient, Correct Site, Correct Laterality,  Correct Procedure, Correct Position, site marked,  Risks and benefits discussed,  Surgical consent,  Pre-op evaluation,  At surgeon's request and post-op pain management  Laterality: Right  Prep: chloraprep       Needles:  Injection technique: Single-shot  Needle Type: Echogenic Needle     Needle Length: 9cm  Needle Gauge: 21     Additional Needles:   Procedures:,,,, ultrasound used (permanent image in chart),,    Narrative:  Start time: 02/04/2024 11:19 AM End time: 02/04/2024 11:27 AM Injection made incrementally with aspirations every 5 mL.  Performed by: Personally  Anesthesiologist: Collene Schlichter, MD  Additional Notes: No pain on injection. No increased resistance to injection. Injection made in 5cc increments.  Good needle visualization.  Patient tolerated procedure well.

## 2024-02-04 NOTE — Evaluation (Signed)
 Physical Therapy Brief Evaluation and Discharge Note Patient Details Name: Robin King MRN: [id number] DOB: 1966-06-28 Today's Date: 02/04/2024   History of Present Illness  58 yo female presents to therapy s/p R TKA on 02/04/2024 due to failure of conservative measures. Pt PMH includes but not limited to: lumbar DDD, peripheral neuropathy, and R carpel tunnel release.  Clinical Impression  Robin King is a 58 y.o. female POD 0 s/p R TKA. Patient reports IND with mobility at baseline. Patient is now limited by functional impairments (see PT problem list below) and requires CGA to min A  for transfers and gait with RW. Patient was able to ambulate 3 feet with min A and RW and an additional 45 feet with R KI donned with RW and CGA and cues for safe walker management. Patient educated on safe sequencing for stair mobility with use of gait belt, fall risk prevention, KI wear schedule and recommendations, slowly increasing activity, pain management and goal, use of CP/ice and car transfers pt  and  friend verbalized understanding of safe guarding position for people assisting with mobility. Patient instructed in exercises to facilitate ROM and circulation reviewed and HO provided. R LE instability noted with LE TE and more pronounced with gait trial, attributed to slow regression of anesthesia impacting R LE motor control and coordination. Nurse called PA, PA in agreement for trial with use of R KI and d/c same day pending on stability with KI donned. Patient will benefit from continued skilled PT interventions to address impairments and progress towards PLOF. Patient has met mobility goals at adequate level for discharge home with family and social support with OPPT services scheduled for 3/3; will continue to follow if pt continues acute stay to progress towards Mod I goals.      PT Assessment All further PT needs can be met in the next venue of care  Assistance Needed at Discharge  Frequent or  constant Supervision/Assistance    Equipment Recommendations None recommended by PT  Recommendations for Other Services       Precautions/Restrictions Precautions Precautions: Knee;Fall Restrictions Weight Bearing Restrictions Per Provider Order: No (R knee instabiltiy noted recommendation for R LE WBAT with KI for safety and stability)        Mobility  Bed Mobility Rolling: Modified independent (Device/Increase time) Supine/Sidelying to sit: Supervision   General bed mobility comments: min cues and HOB elevated  Transfers Overall transfer level: Needs assistance Equipment used: Rolling walker (2 wheels) Transfers: Sit to/from Stand Sit to Stand: Min assist           General transfer comment: min A for safety and stabiltiy with use of RW due to R knee instabiltiy, CGA with RW with R KI donned    Ambulation/Gait Ambulation/Gait assistance: Min assist, Contact guard assist Gait Distance (Feet): 45 Feet (3 without KI donned and min A) Assistive device: Rolling walker (2 wheels) Gait Pattern/deviations: Step-to pattern, Knees buckling, Antalgic, Trunk flexed Gait Speed: Below normal General Gait Details: heavy reliance on B UE support at RW and noted R knee instability for 3 feet with min A, trial with R KI donned and improved R knee stability and pt able to progress to 45 feet with CGA and B UE support at RW wtih slight trunk flexiona and min cues  Home Activity Instructions Home Activity Instructions: slowly increase activity, R KI donned at all times with R LE WB, R KI wear schedule and indications for progression as R quad innervation returns  as a result of slow regression of anesthesia  Stairs Stairs: Yes Stairs assistance: Contact guard assist Stair Management: Two rails Number of Stairs: 2 General stair comments: cues for safety, sequencing and technique with step to pattern and R KI donned with B handrail, pt and friend instructed on safe guarding pt with use  of gait belt to naviagte steps to enter home and both parties verbalized understanding  Modified Rankin (Stroke Patients Only)        Balance Overall balance assessment: Needs assistance Sitting-balance support: Feet supported Sitting balance-Leahy Scale: Good     Standing balance support: Bilateral upper extremity supported, During functional activity, Reliant on assistive device for balance Standing balance-Leahy Scale: Poor            Pertinent Vitals/Pain   Pain Assessment Pain Assessment: 0-10 Pain Score: 3  Pain Location: R LE and knee Pain Descriptors / Indicators: Aching, Burning, Discomfort, Operative site guarding Pain Intervention(s): Limited activity within patient's tolerance, Monitored during session, Premedicated before session, Repositioned     Home Living Family/patient expects to be discharged to:: Private residence Living Arrangements: Children Available Help at Discharge: Family;Friend(s) Home Environment: Stairs to enter  [employer] of Steps: 2 Home Equipment: Agricultural consultant (2 wheels)        Prior Function Level of Independence: Independent Comments: IND no AD for all ADLs, self care tasks and IADLs    UE/LE Assessment        LE ROM/Strength/Tone/Coordination: Impaired LE ROM/Strength/Tone/Coordination Deficits: R LE s/p TKA noted limited quad innervation for SAQ, quad sets and noted instability when in standing, R LE DF/PF 5/5; SLR < 10 degree lag    Communication   Communication Communication: No apparent difficulties     Cognition Overall Cognitive Status: Appears within functional limits for tasks assessed/performed       General Comments      Exercises Total Joint Exercises Ankle Circles/Pumps: AROM, Both, 10 reps Quad Sets: AAROM, Right, 5 reps Short Arc Quad: AAROM, Right, 5 reps Heel Slides: AROM, Right, 5 reps Hip ABduction/ADduction: AROM, Right, 5 reps Straight Leg Raises: AROM, Right, 5 reps    Assessment/Plan    PT Problem List Decreased strength;Decreased range of motion;Decreased activity tolerance;Decreased balance;Decreased mobility;Decreased coordination;Pain       PT Visit Diagnosis Unsteadiness on feet (R26.81);Other abnormalities of gait and mobility (R26.89);Muscle weakness (generalized) (M62.81);Difficulty in walking, not elsewhere classified (R26.2);Pain    No Skilled PT     Co-evaluation                AMPAC 6 Clicks Help needed turning from your back to your side while in a flat bed without using bedrails?: A Little Help needed moving from lying on your back to sitting on the side of a flat bed without using bedrails?: A Little Help needed moving to and from a bed to a chair (including a wheelchair)?: A Little Help needed standing up from a chair using your arms (e.g., wheelchair or bedside chair)?: A Little Help needed to walk in hospital room?: A Little Help needed climbing 3-5 steps with a railing? : A Little 6 Click Score: 18      End of Session Equipment Utilized During Treatment: Gait belt;Right knee immobilizer Activity Tolerance: Patient tolerated treatment well;No increased pain Patient left: in chair;with call bell/phone within reach;with family/visitor present;with nursing/sitter in room Nurse Communication: Mobility status;Other (comment) (pt readiness for d/c from PT standpoint) PT Visit Diagnosis: Unsteadiness on feet (R26.81);Other abnormalities of gait and mobility (  R26.89);Muscle weakness (generalized) (M62.81);Difficulty in walking, not elsewhere classified (R26.2);Pain Pain - Right/Left: Right Pain - part of body: Knee;Leg     Time: 1701-1755 PT Time Calculation (min) (ACUTE ONLY): 54 min  Charges:   PT Evaluation $PT Eval Low Complexity: 1 Low PT Treatments $Gait Training: 8-22 mins $Therapeutic Exercise: 8-22 mins $Therapeutic Activity: 8-22 mins    Johnny Bridge, PT Acute Rehab   Jacqualyn Posey  02/04/2024, 6:41  PM

## 2024-02-04 NOTE — Brief Op Note (Signed)
 02/04/2024  3:04 PM  PATIENT:  Robin King  58 y.o. female  PRE-OPERATIVE DIAGNOSIS:  Right knee osteoarthritis, end stage   POST-OPERATIVE DIAGNOSIS:  Right knee osteoarthritis, end stage  PROCEDURE:  Procedure(s) with comments: TOTAL KNEE ARTHROPLASTY (Right) - general anesthesia DePuy Attune  SURGEON:  Surgeons and Role:    Beverely Low, MD - Primary  PHYSICIAN ASSISTANT:   ASSISTANTS: Thea Gist, PA-C   ANESTHESIA:   regional and spinal  EBL:  25 mL   BLOOD ADMINISTERED:none  DRAINS: none   LOCAL MEDICATIONS USED:  MARCAINE     SPECIMEN:  No Specimen  DISPOSITION OF SPECIMEN:  N/A  COUNTS:  YES  TOURNIQUET:   Total Tourniquet Time Documented: Thigh (Right) - 82 minutes Total: Thigh (Right) - 82 minutes   DICTATION: .Other Dictation: Dictation Number [id number]  PLAN OF CARE: Discharge to home after PACU  PATIENT DISPOSITION:  PACU - hemodynamically stable.   Delay start of Pharmacological VTE agent (>24hrs) due to surgical blood loss or risk of bleeding: no

## 2024-02-04 NOTE — Anesthesia Procedure Notes (Signed)
 Procedure Name: MAC Date/Time: 02/04/2024 1:07 PM  Performed by: Ovidio Kin, CRNAPre-anesthesia Checklist: Patient identified, Emergency Drugs available, Suction available and Patient being monitored Patient Re-evaluated:Patient Re-evaluated prior to induction Dental Injury: Teeth and Oropharynx as per pre-operative assessment

## 2024-02-04 NOTE — Anesthesia Preprocedure Evaluation (Addendum)
 Anesthesia Evaluation  Patient identified by MRN, date of birth, ID band Patient awake    Reviewed: Allergy & Precautions, NPO status , Patient's Chart, lab work & pertinent test results  Airway Mallampati: II  TM Distance: >3 FB Neck ROM: Full    Dental  (+) Teeth Intact, Dental Advisory Given, Caps, Chipped,    Pulmonary former smoker   Pulmonary exam normal breath sounds clear to auscultation       Cardiovascular Exercise Tolerance: Good + CAD  Normal cardiovascular exam Rhythm:Regular Rate:Normal     Neuro/Psych  PSYCHIATRIC DISORDERS Anxiety Depression     Neuromuscular disease    GI/Hepatic Neg liver ROS,GERD  Medicated,,  Endo/Other  Obesity   Renal/GU negative Renal ROS     Musculoskeletal  (+) Arthritis ,  Right knee osteoarthritis   Abdominal   Peds  Hematology negative hematology ROS (+) 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  Reproductive/Obstetrics                             Anesthesia Physical Anesthesia Plan  ASA: 2  Anesthesia Plan: Spinal   Post-op Pain Management: Regional block* and Tylenol PO (pre-op)*   Induction: Intravenous  PONV Risk Score and Plan: 2 and Midazolam, Dexamethasone, TIVA, Ondansetron and Scopolamine patch - Pre-op  Airway Management Planned: Natural Airway and Simple Face Mask  Additional Equipment:   Intra-op Plan:   Post-operative Plan:   Informed Consent: I have reviewed the patients History and Physical, chart, labs and discussed the procedure including the risks, benefits and alternatives for the proposed anesthesia with the patient or authorized representative who has indicated his/her understanding and acceptance.     Dental advisory given  Plan Discussed with: CRNA, Anesthesiologist and Surgeon  Anesthesia Plan Comments: (Discussed risks and benefits of and differences  between spinal and general. Discussed risks of spinal including headache, backache, failure, bleeding, infection, and nerve damage. Patient consents to spinal. Questions answered. Coagulation studies and platelet count acceptable.)        Anesthesia Quick Evaluation

## 2024-02-04 NOTE — Interval H&P Note (Signed)
 History and Physical Interval Note:  02/04/2024 10:47 AM  Robin King  has presented today for surgery, with the diagnosis of Right knee osteoarthritis.  The various methods of treatment have been discussed with the patient and family. After consideration of risks, benefits and other options for treatment, the patient has consented to  Procedure(s) with comments: TOTAL KNEE ARTHROPLASTY (Right) - general anesthesia as a surgical intervention.  The patient's history has been reviewed, patient examined, no change in status, stable for surgery.  I have reviewed the patient's chart and labs.  Questions were answered to the patient's satisfaction.     Verlee Rossetti

## 2024-02-04 NOTE — Discharge Instructions (Signed)
 Ice to the knee constantly.  Keep the incision covered and clean and dry for one week, then ok to get it wet in the shower.  Do exercise as instructed every hour, please to prevent stiffness.    DO NOT prop anything under the knee, it will make your knee stiff.  Prop under the ankle to encourage your knee to go straight.   Use the walker while you are up and around for balance.  Wear your support stockings 24/7 to prevent blood clots and take baby aspirin twice daily for 30 days also to prevent blood clots  Follow up with Dr Ranell Patrick in two weeks in the office, call [phone] for appt  Please call Dr Haakon Titsworth(cell) [phone] with any questions or concerns  INSTRUCTIONS AFTER JOINT REPLACEMENT   Remove items at home which could result in a fall. This includes throw rugs or furniture in walking pathways ICE to the affected joint every three hours while awake for 30 minutes at a time, for at least the first 3-5 days, and then as needed for pain and swelling.  Continue to use ice for pain and swelling. You may notice swelling that will progress down to the foot and ankle.  This is normal after surgery.  Elevate your leg when you are not up walking on it.   Continue to use the breathing machine you got in the hospital (incentive spirometer) which will help keep your temperature down.  It is common for your temperature to cycle up and down following surgery, especially at night when you are not up moving around and exerting yourself.  The breathing machine keeps your lungs expanded and your temperature down.   DIET:  As you were doing prior to hospitalization, we recommend a well-balanced diet.  DRESSING / WOUND CARE / SHOWERING  You may change your dressing 3-5 days after surgery.  Then change the dressing every day with sterile gauze.  Please use good hand washing techniques before changing the dressing.  Do not use any lotions or creams on the incision until instructed by your  surgeon.  ACTIVITY  Increase activity slowly as tolerated, but follow the weight bearing instructions below.   No driving for 6 weeks or until further direction given by your physician.  You cannot drive while taking narcotics.  No lifting or carrying greater than 10 lbs. until further directed by your surgeon. Avoid periods of inactivity such as sitting longer than an hour when not asleep. This helps prevent blood clots.  You may return to work once you are authorized by your doctor.     WEIGHT BEARING   Weight bearing as tolerated with assist device (walker, cane, etc) as directed, use it as long as suggested by your surgeon or therapist, typically at least 4-6 weeks.   EXERCISES  Results after joint replacement surgery are often greatly improved when you follow the exercise, range of motion and muscle strengthening exercises prescribed by your doctor. Safety measures are also important to protect the joint from further injury. Any time any of these exercises cause you to have increased pain or swelling, decrease what you are doing until you are comfortable again and then slowly increase them. If you have problems or questions, call your caregiver or physical therapist for advice.   Rehabilitation is important following a joint replacement. After just a few days of immobilization, the muscles of the leg can become weakened and shrink (atrophy).  These exercises are designed to build up the  tone and strength of the thigh and leg muscles and to improve motion. Often times heat used for twenty to thirty minutes before working out will loosen up your tissues and help with improving the range of motion but do not use heat for the first two weeks following surgery (sometimes heat can increase post-operative swelling).   These exercises can be done on a training (exercise) mat, on the floor, on a table or on a bed. Use whatever works the best and is most comfortable for you.    Use music or  television while you are exercising so that the exercises are a pleasant break in your day. This will make your life better with the exercises acting as a break in your routine that you can look forward to.   Perform all exercises about fifteen times, three times per day or as directed.  You should exercise both the operative leg and the other leg as well.  Exercises include:   Quad Sets - Tighten up the muscle on the front of the thigh (Quad) and hold for 5-10 seconds.   Straight Leg Raises - With your knee straight (if you were given a brace, keep it on), lift the leg to 60 degrees, hold for 3 seconds, and slowly lower the leg.  Perform this exercise against resistance later as your leg gets stronger.  Leg Slides: Lying on your back, slowly slide your foot toward your buttocks, bending your knee up off the floor (only go as far as is comfortable). Then slowly slide your foot back down until your leg is flat on the floor again.  Angel Wings: Lying on your back spread your legs to the side as far apart as you can without causing discomfort.  Hamstring Strength:  Lying on your back, push your heel against the floor with your leg straight by tightening up the muscles of your buttocks.  Repeat, but this time bend your knee to a comfortable angle, and push your heel against the floor.  You may put a pillow under the heel to make it more comfortable if necessary.   A rehabilitation program following joint replacement surgery can speed recovery and prevent re-injury in the future due to weakened muscles. Contact your doctor or a physical therapist for more information on knee rehabilitation.    CONSTIPATION  Constipation is defined medically as fewer than three stools per week and severe constipation as less than one stool per week.  Even if you have a regular bowel pattern at home, your normal regimen is likely to be disrupted due to multiple reasons following surgery.  Combination of anesthesia,  postoperative narcotics, change in appetite and fluid intake all can affect your bowels.   YOU MUST use at least one of the following options; they are listed in order of increasing strength to get the job done.  They are all available over the counter, and you may need to use some, POSSIBLY even all of these options:    Drink plenty of fluids (prune juice may be helpful) and high fiber foods Colace 100 mg by mouth twice a day  Senokot for constipation as directed and as needed Dulcolax (bisacodyl), take with full glass of water  Miralax (polyethylene glycol) once or twice a day as needed.  If you have tried all these things and are unable to have a bowel movement in the first 3-4 days after surgery call either your surgeon or your primary doctor.    If you experience loose  stools or diarrhea, hold the medications until you stool forms back up.  If your symptoms do not get better within 1 week or if they get worse, check with your doctor.  If you experience "the worst abdominal pain ever" or develop nausea or vomiting, please contact the office immediately for further recommendations for treatment.   ITCHING:  If you experience itching with your medications, try taking only a single pain pill, or even half a pain pill at a time.  You can also use Benadryl over the counter for itching or also to help with sleep.   TED HOSE STOCKINGS:  Use stockings on both legs until for at least 2 weeks or as directed by physician office. They may be removed at night for sleeping.  MEDICATIONS:  See your medication summary on the "After Visit Summary" that nursing will review with you.  You may have some home medications which will be placed on hold until you complete the course of blood thinner medication.  It is important for you to complete the blood thinner medication as prescribed.  PRECAUTIONS:  If you experience chest pain or shortness of breath - call 911 immediately for transfer to the hospital emergency  department.   If you develop a fever greater that 101 F, purulent drainage from wound, increased redness or drainage from wound, foul odor from the wound/dressing, or calf pain - CONTACT YOUR SURGEON.                                                   FOLLOW-UP APPOINTMENTS:  If you do not already have a post-op appointment, please call the office for an appointment to be seen by your surgeon.  Guidelines for how soon to be seen are listed in your "After Visit Summary", but are typically between 1-4 weeks after surgery.  OTHER INSTRUCTIONS:   Knee Replacement:  Do not place pillow under knee, focus on keeping the knee straight while resting. CPM instructions: 0-90 degrees, 2 hours in the morning, 2 hours in the afternoon, and 2 hours in the evening. Place foam block, curve side up under heel at all times except when in CPM or when walking.  DO NOT modify, tear, cut, or change the foam block in any way.  POST-OPERATIVE OPIOID TAPER INSTRUCTIONS: It is important to wean off of your opioid medication as soon as possible. If you do not need pain medication after your surgery it is ok to stop day one. Opioids include: Codeine, Hydrocodone(Norco, Vicodin), Oxycodone(Percocet, oxycontin) and hydromorphone amongst others.  Long term and even short term use of opiods can cause: Increased pain response Dependence Constipation Depression Respiratory depression And more.  Withdrawal symptoms can include Flu like symptoms Nausea, vomiting And more Techniques to manage these symptoms Hydrate well Eat regular healthy meals Stay active Use relaxation techniques(deep breathing, meditating, yoga) Do Not substitute Alcohol to help with tapering If you have been on opioids for less than two weeks and do not have pain than it is ok to stop all together.  Plan to wean off of opioids This plan should start within one week post op of your joint replacement. Maintain the same interval or time between taking  each dose and first decrease the dose.  Cut the total daily intake of opioids by one tablet each day Next start to increase the  time between doses. The last dose that should be eliminated is the evening dose.   MAKE SURE YOU:  Understand these instructions.  Get help right away if you are not doing well or get worse.    Thank you for letting us be a part of your medical care team.  It is a privilege we respect greatly.  We hope these instructions will help you stay on track for a fast and full recovery!

## 2024-02-07 ENCOUNTER — Encounter (HOSPITAL_COMMUNITY): Payer: Self-pay | Admitting: Orthopedic Surgery

## 2024-02-28 ENCOUNTER — Encounter: Payer: Self-pay | Admitting: Family

## 2024-02-28 ENCOUNTER — Ambulatory Visit: Payer: Managed Care, Other (non HMO) | Admitting: Family

## 2024-02-28 VITALS — BP 122/84 | HR 78 | Temp 98.0°F | Ht 66.0 in | Wt 230.4 lb

## 2024-02-28 DIAGNOSIS — R7303 Prediabetes: Secondary | ICD-10-CM | POA: Insufficient documentation

## 2024-02-28 DIAGNOSIS — Z96651 Presence of right artificial knee joint: Secondary | ICD-10-CM | POA: Insufficient documentation

## 2024-02-28 DIAGNOSIS — B009 Herpesviral infection, unspecified: Secondary | ICD-10-CM | POA: Diagnosis not present

## 2024-02-28 DIAGNOSIS — E782 Mixed hyperlipidemia: Secondary | ICD-10-CM | POA: Insufficient documentation

## 2024-02-28 DIAGNOSIS — Z7901 Long term (current) use of anticoagulants: Secondary | ICD-10-CM | POA: Insufficient documentation

## 2024-02-28 DIAGNOSIS — E559 Vitamin D deficiency, unspecified: Secondary | ICD-10-CM | POA: Insufficient documentation

## 2024-02-28 DIAGNOSIS — K2100 Gastro-esophageal reflux disease with esophagitis, without bleeding: Secondary | ICD-10-CM | POA: Insufficient documentation

## 2024-02-28 DIAGNOSIS — R41840 Attention and concentration deficit: Secondary | ICD-10-CM | POA: Diagnosis not present

## 2024-02-28 DIAGNOSIS — Z79899 Other long term (current) drug therapy: Secondary | ICD-10-CM | POA: Insufficient documentation

## 2024-02-28 MED ORDER — AMPHETAMINE-DEXTROAMPHETAMINE 20 MG PO TABS: 20.0000 mg | ORAL_TABLET | Freq: Two times a day (BID) | ORAL | 0 refills | Status: DC

## 2024-02-28 MED ORDER — ONDANSETRON HCL 4 MG PO TABS: 4.0000 mg | ORAL_TABLET | Freq: Three times a day (TID) | ORAL | 1 refills | Status: DC | PRN

## 2024-02-28 NOTE — Assessment & Plan Note (Signed)
Poc A1c today  Pt advised of the following: Work on a diabetic diet, try to incorporate exercise at least 20-30 a day for 3 days a week or more.

## 2024-02-28 NOTE — Progress Notes (Signed)
 New Patient Office Visit  Subjective:  Patient ID: Robin King, female    DOB: May 23, 1966  Age: 58 y.o. MRN: [id number]  CC:  Chief Complaint  Patient presents with   Establish Care    HPI Robin King is here to establish care as a new patient.  Oriented to practice routines and expectations.  Prior provider was: in [place],last seen in feb 2024   Pt is without acute concerns.   ADD: on adderall 20 mg twice daily. Diagnosed in 2006 by her pcp. She needs to take this daily otherwise can not complete a task and her mind is 'all over the place' often with lists in her head, will start one and then quit. She notices a huge difference when she is taking the adderall. She is on adderall 20 mg in am, and around 1 pm takes 20 mg  chronic concerns:  Anxiety, xanax a few times a month. Doesn't take too regularly per her report. Last fill 12/25. She has seen a therapist in the past but 'it did no good'  Post op right knee replacement, currently on aspirin 81 mg twice daily.   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   Current Outpatient Medications:    ALPRAZolam (XANAX) 1 MG tablet, Take 1 mg by mouth 2 (two) times daily as needed for anxiety., Disp: , Rfl:    [START ON 03/20/2024] amphetamine-dextroamphetamine (ADDERALL) 20 MG tablet, Take 1 tablet (20 mg total) by mouth 2 (two) times daily., Disp: 60 tablet, Rfl: 0   [START ON 04/10/2024] amphetamine-dextroamphetamine (ADDERALL) 20 MG tablet, Take 1 tablet (20 mg total) by mouth 2 (two) times daily., Disp: 60 tablet, Rfl: 0   aspirin (ASPIRIN CHILDRENS) 81 MG chewable tablet, Chew 1 tablet (81 mg total) by mouth 2 (two) times daily., Disp: 60 tablet, Rfl: 0   atorvastatin (LIPITOR) 20 MG tablet, Take 1 tablet (20 mg total) by mouth every evening., Disp: 90 tablet, Rfl: 3   buPROPion (WELLBUTRIN XL) 300 MG 24 hr tablet, Take 300 mg by mouth in the morning., Disp: , Rfl:     celecoxib (CELEBREX) 200 MG capsule, Take 200 mg by mouth in the morning., Disp: , Rfl:    chlorhexidine (HIBICLENS) 4 % external liquid, Apply 15 mLs (1 Application total) topically as directed for 30 doses. Use as directed daily for 5 days every other week for 6 weeks., Disp: 946 mL, Rfl: 1   Cholecalciferol (VITAMIN D-3 PO), Take 1 tablet by mouth at bedtime., Disp: , Rfl:    Cyanocobalamin (VITAMIN B-12 PO), Take 1 tablet by mouth daily in the afternoon., Disp: , Rfl:    gabapentin (NEURONTIN) 300 MG capsule, Take 300-600 mg by mouth See admin instructions. Take 1 capsule (300 mg) by mouth in the morning, take 1 capsule (300 mg) by mouth in the afternoon & take 2 capsules (600 mg) by mouth at night., Disp: , Rfl:    Glucosamine-Chondroitin (COSAMIN DS PO), Take 1 tablet by mouth at bedtime., Disp: , Rfl:    L-LYSINE PO, Take 1 tablet by mouth daily in the afternoon., Disp: , Rfl:    methocarbamol (ROBAXIN) 750 MG tablet, Take 750-1,500 mg by mouth See admin instructions. Take 1 tablet (750 mg) by mouth in the morning, take 1 tablet (750 mg) by mouth in the afternoon & take 2 tablets (1500 mg) by mouth at night., Disp: , Rfl:  mupirocin ointment (BACTROBAN) 2 %, Place 1 Application into the nose 2 (two) times daily for 60 doses. Use as directed 2 times daily for 5 days every other week for 6 weeks., Disp: 60 g, Rfl: 0   omeprazole (PRILOSEC) 40 MG capsule, Take 40 mg by mouth in the morning., Disp: , Rfl:    oxyCODONE-acetaminophen (PERCOCET) 5-325 MG tablet, Take 1-2 tablets by mouth every 4 (four) hours as needed for severe pain (pain score 7-10)., Disp: 40 tablet, Rfl: 0   TURMERIC PO, Take 1 capsule by mouth in the morning, at noon, and at bedtime., Disp: , Rfl:    valACYclovir (VALTREX) 500 MG tablet, Take 500 mg by mouth in the morning., Disp: , Rfl:    amphetamine-dextroamphetamine (ADDERALL) 20 MG tablet, Take 1 tablet (20 mg total) by mouth 2 (two) times daily., Disp: 60 tablet, Rfl: 0    ondansetron (ZOFRAN) 4 MG tablet, Take 1 tablet (4 mg total) by mouth every 8 (eight) hours as needed., Disp: 30 tablet, Rfl: 1 Past Medical History:  Diagnosis Date   Anxiety    Arthritis    Complication of anesthesia    DDD (degenerative disc disease), lumbar    Depression    GERD (gastroesophageal reflux disease) 1993   Hyperlipidemia 2024   Peripheral neuropathy    Reflux esophagitis    Past Surgical History:  Procedure Laterality Date   CARPAL TUNNEL RELEASE Right 2010, 09/2017   both   CESAREAN SECTION  1993, 1996   x 2   CHOLECYSTECTOMY  1995   1994   JOINT REPLACEMENT  02/04/24   KNEE ARTHROSCOPY Right 1985, 1987   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   Procedure: TOTAL KNEE ARTHROPLASTY;  Surgeon: Beverely Low, MD;  Location: [facility];  Service: Orthopedics;  Laterality: Right;  general anesthesia   TRIGGER FINGER RELEASE      Objective:   Today's Vitals: BP 122/84 (BP Location: Left Arm, Patient Position: Sitting, Cuff Size: Normal)   Pulse 78   Temp 98 F (36.7 C) (Temporal)   Ht 5\' 6"  (1.676 m)   Wt 230 lb 6.4 oz (104.5 kg)   SpO2 97%   BMI 37.19 kg/m   Physical Exam Vitals reviewed.  Constitutional:      General: She is not in acute distress.    Appearance: Normal appearance. She is obese. She is not ill-appearing, toxic-appearing or diaphoretic.  HENT:     Head: Normocephalic.  Cardiovascular:     Rate and Rhythm: Normal rate and regular rhythm.  Pulmonary:     Effort: Pulmonary effort is normal.     Breath sounds: Normal breath sounds.  Musculoskeletal:        General: Normal range of motion.  Neurological:     General: No focal deficit present.     Mental Status: She is alert and oriented to person, place, and time. Mental status is at baseline.  Psychiatric:        Mood and Affect: Mood normal.        Behavior: Behavior normal.        Thought Content: Thought content normal.        Judgment: Judgment normal.     Assessment & Plan:   Attention or concentration deficit Assessment & Plan: Pdmp reviewed Uds and drug contract signed today  Ok to refill adderall 20 mg twice daily Discussed history with her and discussed risks  Orders: -     Amphetamine-Dextroamphetamine; Take 1 tablet (20 mg  total) by mouth 2 (two) times daily.  Dispense: 60 tablet; Refill: 0 -     Amphetamine-Dextroamphetamine; Take 1 tablet (20 mg total) by mouth 2 (two) times daily.  Dispense: 60 tablet; Refill: 0 -     Amphetamine-Dextroamphetamine; Take 1 tablet (20 mg total) by mouth 2 (two) times daily.  Dispense: 60 tablet; Refill: 0  History of right knee joint replacement  HSV-1 infection Assessment & Plan: Cont suppressive therapy    Vitamin D deficiency Assessment & Plan: Cont otc vitamin D   Mixed hyperlipidemia Assessment & Plan: Continue atorvastin, Work on low cholesterol diet and exercise as tolerated    Gastroesophageal reflux disease with esophagitis without hemorrhage Assessment & Plan: Try to decrease and or avoid spicy foods, fried fatty foods, and also caffeine and chocolate as these can increase heartburn symptoms.     High risk medication use Assessment & Plan: Pdmp reviewed Uds ordered Ok to refill adderall once needed Drug contract signed Did d/w her the risks and possible s/e of high risk medication use.  Do not drink alcohol would interact poorly  Pt to f/u with pain management and will get refills for robaxin, gabapentin and pain medication from them. She verbalized understanding.  Orders: -     DRUG MONITORING, PANEL 8 WITH CONFIRMATION, URINE; Future  Prediabetes Assessment & Plan: Poc A1c today  Pt advised of the following: Work on a diabetic diet, try to incorporate exercise at least 20-30 a day for 3 days a week or more.     Anticoagulated Assessment & Plan: D/w pt caution with NSAIDS  On celebrex, discussed risks of use with asa  Continue prilosec for protection  Take celebrex with  meds  Advised to watch for s/s gi bleed   Other orders -     Ondansetron HCl; Take 1 tablet (4 mg total) by mouth every 8 (eight) hours as needed.  Dispense: 30 tablet; Refill: 1    Follow-up: Return in about 3 months (around 05/30/2024) for f/u cholesterol, f/u ADD medication.   Mort Sawyers, FNP

## 2024-02-28 NOTE — Assessment & Plan Note (Signed)
 Continue atorvastin, Work on low cholesterol diet and exercise as tolerated

## 2024-02-28 NOTE — Assessment & Plan Note (Signed)
 Try to decrease and or avoid spicy foods, fried fatty foods, and also caffeine and chocolate as these can increase heartburn symptoms.

## 2024-02-28 NOTE — Assessment & Plan Note (Addendum)
 D/w pt caution with NSAIDS  On celebrex, discussed risks of use with asa  Continue prilosec for protection  Take celebrex with meds  Advised to watch for s/s gi bleed

## 2024-02-28 NOTE — Assessment & Plan Note (Signed)
 Pdmp reviewed Uds and drug contract signed today  Ok to refill adderall 20 mg twice daily Discussed history with her and discussed risks

## 2024-02-28 NOTE — Assessment & Plan Note (Addendum)
 Pdmp reviewed Uds ordered Ok to refill adderall once needed Drug contract signed Did d/w her the risks and possible s/e of high risk medication use.  Do not drink alcohol would interact poorly  Pt to f/u with pain management and will get refills for robaxin, gabapentin and pain medication from them. She verbalized understanding.

## 2024-02-28 NOTE — Assessment & Plan Note (Signed)
 Cont otc vitamin D

## 2024-02-28 NOTE — Assessment & Plan Note (Signed)
 Cont suppressive therapy

## 2024-04-17 ENCOUNTER — Encounter: Payer: Self-pay | Admitting: Cardiovascular Disease

## 2024-04-25 ENCOUNTER — Encounter: Payer: Self-pay | Admitting: Family

## 2024-05-03 ENCOUNTER — Other Ambulatory Visit

## 2024-05-03 DIAGNOSIS — Z79899 Other long term (current) drug therapy: Secondary | ICD-10-CM

## 2024-05-06 ENCOUNTER — Encounter: Payer: Self-pay | Admitting: Family

## 2024-05-06 LAB — DRUG MONITORING, PANEL 8 WITH CONFIRMATION, URINE
6 Acetylmorphine: NEGATIVE ng/mL (ref <10)
Alcohol Metabolites: POSITIVE ng/mL — AB (ref <500)
Alphahydroxyalprazolam: 139 ng/mL — ABNORMAL HIGH (ref <25)
Alphahydroxymidazolam: NEGATIVE ng/mL (ref <50)
Alphahydroxytriazolam: NEGATIVE ng/mL (ref <50)
Aminoclonazepam: NEGATIVE ng/mL (ref <25)
Amphetamine: 10561 ng/mL — ABNORMAL HIGH (ref <250)
Amphetamines: POSITIVE ng/mL — AB (ref <500)
Benzodiazepines: POSITIVE ng/mL — AB (ref <100)
Buprenorphine, Urine: NEGATIVE ng/mL (ref <5)
Cocaine Metabolite: NEGATIVE ng/mL (ref <150)
Codeine: NEGATIVE ng/mL (ref <50)
Creatinine: 173.9 mg/dL (ref >=20.0)
Ethyl Glucuronide (ETG): >100000 ng/mL — ABNORMAL HIGH (ref <500)
Ethyl Sulfate (ETS): 19550 ng/mL — ABNORMAL HIGH (ref <100)
Hydrocodone: 1566 ng/mL — ABNORMAL HIGH (ref <50)
Hydromorphone: 94 ng/mL — ABNORMAL HIGH (ref <50)
Hydroxyethylflurazepam: NEGATIVE ng/mL (ref <50)
Lorazepam: NEGATIVE ng/mL (ref <50)
MDMA: NEGATIVE ng/mL (ref <500)
Marijuana Metabolite: NEGATIVE ng/mL (ref <20)
Methamphetamine: NEGATIVE ng/mL (ref <250)
Morphine: NEGATIVE ng/mL (ref <50)
Nordiazepam: NEGATIVE ng/mL (ref <50)
Norhydrocodone: 3179 ng/mL — ABNORMAL HIGH (ref <50)
Opiates: POSITIVE ng/mL — AB (ref <100)
Oxazepam: NEGATIVE ng/mL (ref <50)
Oxidant: NEGATIVE ug/mL (ref <200)
Oxycodone: NEGATIVE ng/mL (ref <100)
Temazepam: NEGATIVE ng/mL (ref <50)
pH: 5.8 (ref 4.5–9.0)

## 2024-05-06 LAB — DM TEMPLATE

## 2024-05-07 ENCOUNTER — Ambulatory Visit: Payer: Self-pay | Admitting: Family

## 2024-05-07 DIAGNOSIS — R41840 Attention and concentration deficit: Secondary | ICD-10-CM

## 2024-05-08 ENCOUNTER — Telehealth: Payer: Self-pay | Admitting: Family

## 2024-05-08 NOTE — Telephone Encounter (Signed)
 Received a surgical clearance form the pt for total knee arthroplasty. Pt will need a surgical clearance appointment. Please contact pt to make this appointment. Thank you!

## 2024-05-08 NOTE — Telephone Encounter (Signed)
 Spoke to pt, scheduled surgical clearance for 05/15/24

## 2024-05-09 NOTE — Telephone Encounter (Signed)
 Robin King can you please message this back to the patient   " The response from the lab tech in regards to this in the future:  Please notify your provider or the Phlebotomist when in the lab for any testing. We have no way of knowing your labs need to go to [facility]. We use our lab for most tests with [facility] and [facility] for any testing that our [place] lab doesn't perform. "

## 2024-05-15 ENCOUNTER — Encounter: Payer: Self-pay | Admitting: Family

## 2024-05-15 ENCOUNTER — Ambulatory Visit: Admitting: Family

## 2024-05-15 ENCOUNTER — Telehealth: Payer: Self-pay | Admitting: Family

## 2024-05-15 VITALS — HR 67 | Temp 97.7°F | Ht 66.0 in | Wt 216.4 lb

## 2024-05-15 DIAGNOSIS — J439 Emphysema, unspecified: Secondary | ICD-10-CM | POA: Insufficient documentation

## 2024-05-15 DIAGNOSIS — Z01818 Encounter for other preprocedural examination: Secondary | ICD-10-CM | POA: Diagnosis not present

## 2024-05-15 DIAGNOSIS — I2510 Atherosclerotic heart disease of native coronary artery without angina pectoris: Secondary | ICD-10-CM

## 2024-05-15 DIAGNOSIS — I700 Atherosclerosis of aorta: Secondary | ICD-10-CM

## 2024-05-15 DIAGNOSIS — R7303 Prediabetes: Secondary | ICD-10-CM | POA: Diagnosis not present

## 2024-05-15 DIAGNOSIS — E782 Mixed hyperlipidemia: Secondary | ICD-10-CM | POA: Diagnosis not present

## 2024-05-15 DIAGNOSIS — I2721 Secondary pulmonary arterial hypertension: Secondary | ICD-10-CM

## 2024-05-15 DIAGNOSIS — R918 Other nonspecific abnormal finding of lung field: Secondary | ICD-10-CM | POA: Insufficient documentation

## 2024-05-15 NOTE — Assessment & Plan Note (Addendum)
 Cxr pending Labs pending Referral to pulmonary for clearance to surgery in reference to new finding pulmonary htn, emphysema on CT lung. See note from 7/21 from pulmonologist Dedra Sanders, pt has been cleared as low risk for pulmonary.  Currently asymptomatic which is reassuring. Verbal report taken for cardiology clearance, no office needed per Dr. Court. EKG NSR  T wave inversion V3 non specific HR 61 bpm

## 2024-05-15 NOTE — Progress Notes (Addendum)
 Assessment & Plan:   Coronary artery calcification seen on CT scan  Mixed hyperlipidemia  Prediabetes Assessment & Plan: Pt advised of the following: Work on a diabetic diet, try to incorporate exercise at least 20-30 a day for 3 days a week or more.    Orders: -     Hemoglobin A1c; Future  Pre-op evaluation Assessment & Plan: Cxr pending Labs pending Referral to pulmonary for clearance to surgery in reference to new finding pulmonary htn, emphysema on CT lung. See note from 7/21 from pulmonologist Dedra Sanders, pt has been cleared as low risk for pulmonary.  Currently asymptomatic which is reassuring. Verbal report taken for cardiology clearance, no office needed per Dr. Court. EKG NSR  T wave inversion V3 non specific HR 61 bpm    Orders: -     EKG 12-Lead -     Comprehensive metabolic panel with GFR; Future -     Urine Culture; Future -     Protime-INR; Future -     DG Chest 2 View; Future -     CBC; Future -     Urinalysis, Routine w reflex microscopic; Future -     hCG, serum, qualitative  Pulmonary arterial hypertension (HCC) Assessment & Plan: Noted on CT lung scan.  Pt seeing cardiology however with emphysema and pulmonary htn referral placed for pulmonary as well.  Currently asymptomatic  Orders: -     Pulmonary Visit -     DG Chest 2 View; Future  Aortic atherosclerosis (HCC)  Pulmonary emphysema, unspecified emphysema type (HCC) Assessment & Plan: Noted on ct lung  Orders: -     Pulmonary Visit -     DG Chest 2 View; Future  Abnormal CT lung screening -     Pulmonary Visit    I have independently evaluated patient.  Robin King is a 58 y.o. female who is low risk for a intermediate risk surgery.  There are modifiable risk factors (smoking, etc). Doneen A Bistline's RCRI/NSQIP calculation for MACE is: .09-1.7%.    Review meds: If indicated, NO ACE-I/ARB day of surgery. OK to do BB or statin day of (esp if vascular sx)  No follow-ups  on file.  Ginger Patrick, MSN, APRN, FNP-C [facility]  Subjective:      HPI: Pt is a 58 y.o. female who is here for preoperative clearance for left total knee arthroplasty with general anesthesia  1) High Risk Cardiac Conditions:  1) Recent MI - No.  2) Decompensated Heart Failure - No.  3) Unstable angina - No.  4) Symptomatic arrythmia - No.  5) Sx Valvular Disease - No.  2) Intermediate Risk Factors: DM, CKD, CVA, CHF, CAD - Yes.   Aortic atherosclerosis, pulmonary hypertension, emphysema  2) Functional Status: > 4 mets (Walk, run, climb stairs) Yes.  Robin King   3) Surgery Specific Risk: High (Emergency, Vascular, Intra-abdominal, Extensive ops)          Intermediate (Carotid, Head and Neck, Orthopaedic )          Low (Endoscopic, Cataract, Breast )  4) Further Noninvasive evaluation:   1) EKG - Yes.     Hx of MI, CVA, CAD, DM, CKD  2) Echo - No.   Worsening dyspnea or CHF without an echo in the past year  3) Stress Testing - Active Cardiac Disease - No.  4) CXR Yes.     If asymptomatic, healthy, no respiratory symptoms it is not needed  5)PFTs No. Will refer to pulmonary for pulmonary clearance   OSA, OHS, or significant cardiopulmonary history  5) Need for medical therapy - Beta Blocker, Statins indicated ? No.  New complaints: N/a  Social history:  Relevant past medical, surgical, family and social history reviewed and updated as indicated. Interim medical history since our last visit reviewed.  Allergies and medications reviewed and updated.  DATA REVIEWED: CHART IN EPIC  ROS: Negative unless specifically indicated above in HPI.    Current Outpatient Medications:    atorvastatin  (LIPITOR) 20 MG tablet, Take 1 tablet (20 mg total) by mouth every evening., Disp: 90 tablet, Rfl: 3   buPROPion (WELLBUTRIN XL) 300 MG 24 hr tablet, Take 300 mg by mouth in the morning., Disp: , Rfl:    celecoxib (CELEBREX) 200 MG capsule, Take 200 mg by mouth  in the morning., Disp: , Rfl:    Cholecalciferol (VITAMIN D-3 PO), Take 1 tablet by mouth at bedtime., Disp: , Rfl:    Cyanocobalamin (VITAMIN B-12 PO), Take 1 tablet by mouth daily in the afternoon., Disp: , Rfl:    gabapentin (NEURONTIN) 300 MG capsule, Take 300-600 mg by mouth See admin instructions. Take 1 capsule (300 mg) by mouth in the morning, take 1 capsule (300 mg) by mouth in the afternoon & take 2 capsules (600 mg) by mouth at night., Disp: , Rfl:    Glucosamine-Chondroitin (COSAMIN DS PO), Take 1 tablet by mouth at bedtime., Disp: , Rfl:    L-LYSINE PO, Take 1 tablet by mouth daily in the afternoon., Disp: , Rfl:    methocarbamol (ROBAXIN) 750 MG tablet, Take 750-1,500 mg by mouth See admin instructions. Take 1 tablet (750 mg) by mouth in the morning, take 1 tablet (750 mg) by mouth in the afternoon & take 2 tablets (1500 mg) by mouth at night., Disp: , Rfl:    omeprazole (PRILOSEC) 40 MG capsule, Take 40 mg by mouth in the morning., Disp: , Rfl:    TURMERIC PO, Take 1 capsule by mouth in the morning, at noon, and at bedtime., Disp: , Rfl:    valACYclovir (VALTREX) 500 MG tablet, Take 500 mg by mouth in the morning., Disp: , Rfl:    albuterol  (VENTOLIN  HFA) 108 (90 Base) MCG/ACT inhaler, Inhale 2 puffs into the lungs every 6 (six) hours as needed. (Patient not taking: Reported on 06/26/2024), Disp: , Rfl:    ALPRAZolam  (XANAX ) 1 MG tablet, Take 1-2 tablets once daily prn anxiety, Disp: 20 tablet, Rfl: 1   [START ON 07/17/2024] amphetamine -dextroamphetamine  (ADDERALL) 20 MG tablet, Take 1 tablet (20 mg total) by mouth 2 (two) times daily., Disp: 60 tablet, Rfl: 0   amphetamine -dextroamphetamine  (ADDERALL) 20 MG tablet, Take 1 tablet (20 mg total) by mouth 2 (two) times daily., Disp: 60 tablet, Rfl: 0   amphetamine -dextroamphetamine  (ADDERALL) 20 MG tablet, Take 1 tablet (20 mg total) by mouth 2 (two) times daily., Disp: 60 tablet, Rfl: 0   HYDROcodone-acetaminophen  (NORCO) 10-325 MG tablet,  Take 0.5-1 tablets by mouth 3 (three) times daily as needed for severe pain (pain score 7-10) or moderate pain (pain score 4-6)., Disp: , Rfl:       Objective:    Pulse 67   Temp 97.7 F (36.5 C) (Temporal)   Ht 5' 6 (1.676 m)   Wt 216 lb 6.4 oz (98.2 kg)   SpO2 97%   BMI 34.93 kg/m   Wt Readings from Last 3 Encounters:  06/26/24 216 lb (98 kg)  06/05/24  216 lb (98 kg)  05/15/24 216 lb 6.4 oz (98.2 kg)    Physical Exam Constitutional:      General: She is not in acute distress.    Appearance: Normal appearance. She is normal weight. She is not ill-appearing.  HENT:     Head: Normocephalic.     Right Ear: Tympanic membrane normal.     Left Ear: Tympanic membrane normal.     Nose: Nose normal.     Mouth/Throat:     Mouth: Mucous membranes are moist.  Eyes:     Extraocular Movements: Extraocular movements intact.     Pupils: Pupils are equal, round, and reactive to light.  Cardiovascular:     Rate and Rhythm: Normal rate and regular rhythm.  Pulmonary:     Effort: Pulmonary effort is normal.     Breath sounds: Normal breath sounds.  Abdominal:     Tenderness: There is no rebound.  Musculoskeletal:        General: Normal range of motion.     Cervical back: Normal range of motion.  Skin:    General: Skin is warm.     Capillary Refill: Capillary refill takes less than 2 seconds.  Neurological:     General: No focal deficit present.     Mental Status: She is alert.  Psychiatric:        Mood and Affect: Mood normal.        Behavior: Behavior normal.        Thought Content: Thought content normal.        Judgment: Judgment normal.    mMRC dyspnea scale, asked in office, on a scale 0-4  0 I only get breathless with strenuous exercise  Have you had 2 or more moderate exacerbations or at least 1 hospitalization for COPD exacerbation in the past year? No

## 2024-05-15 NOTE — Assessment & Plan Note (Signed)
 Noted on CT lung scan.  Pt seeing cardiology however with emphysema and pulmonary htn referral placed for pulmonary as well.  Currently asymptomatic

## 2024-05-15 NOTE — Addendum Note (Signed)
 Addended by: Gerry Krone on: 05/15/2024 11:20 AM   Modules accepted: Orders

## 2024-05-15 NOTE — Telephone Encounter (Signed)
 Good morning Dr. Danney Dutton patient of ours about to have a total left knee arthroplasty.  Curious if you needed to see patient for a cardiac clearance and or if you would be ok with a verbal clearance? Saw your last note in December, overall seems stable in regards to what you are following. Thanks in advance.

## 2024-05-15 NOTE — Telephone Encounter (Signed)
 Hey terri can you look into this?  I believe this is already the process we use but she wants to advise us  how things are done, so if you could double check this is correct then I'd appreciate it.   Can we also make sure the routine urinalysis is sent to [facility] as well? We already fixed the poct urine. Thanks.

## 2024-05-15 NOTE — Assessment & Plan Note (Signed)
 Pt advised of the following: Work on a diabetic diet, try to incorporate exercise at least 20-30 a day for 3 days a week or more.

## 2024-05-15 NOTE — Addendum Note (Signed)
 Addended by: Gerry Krone on: 05/15/2024 11:11 AM   Modules accepted: Orders

## 2024-05-15 NOTE — Assessment & Plan Note (Signed)
 Noted on ct lung

## 2024-05-15 NOTE — Telephone Encounter (Signed)
 Can you please release future orders to [facility] for pt

## 2024-05-30 NOTE — Progress Notes (Signed)
 Psychiatric Initial Adult Assessment  Patient Identification: Robin King MRN:  [id number] Date of Evaluation:  06/03/2024 Referral Source: Ginger Patrick FNP  Assessment:  Robin King is a 58 y.o. female with a history of GAD, MDD in remission, GAD, historical ADHD, emphysema, osteoarthritis on opioids, and prediabetes who presents to [facility] via video conferencing for initial evaluation of ADHD.  Patient presents today at request of PCP for ADHD evaluation. Patient reports initial diagnosis made in adulthood in 2006 by prior PCP due to significant difficulty focusing in both home and work settings at the time. She was started on Adderall which she feels has been of substantial benefit for inattentive symptoms and overall functioning. She notes being diagnosed with depression and anxiety around this time as well although feels that despite remission of depressive symptoms for several years and manageable level of anxiety, inattention persists if without stimulant medication. On initial evaluation today, patient does report trouble organizing tasks, issues with time management, and easy distractibility when off medication. As a child she reports talking excessively and leaning on mother to remain organized. Patient has difficulty expounding upon symptoms and providing concrete examples in childhood and adulthood - unclear if this is related to poor emotional insight vs. inappropriate diagnosis. However, given limitations in clinical evaluation alone in this patient, have recommended neuropsychological testing for further evaluation. Establishing correct diagnosis is especially important in this patient on concurrent opioids and occasional Xanax as well. Recommended that patient continue to be followed by psychiatry to review results of testing and ensure ongoing monitoring and management of mood/anxiety symptoms, however patient declines citing desire to minimize number of  different providers. As she denies any side effects from stimulant, feel it would be reasonable for PCP to continue medication while neuropsychological assessment is pursued. Will share this discussion with primary care provider and have made patient aware she may reach back out to clinic should she desire psychiatric involvement in the future.  Plan:  # Historical diagnosis of ADHD, dx made by PCP in 2006 Past medication trials: none Status of problem: new problem to this provider Interventions: -- Patient prescribed Adderall 20 mg BID by PCP -- Referral placed to [facility] for neuropsychological testing  # MDD in remission # GAD Past medication trials: Lexapro, gabapentin, Ativan (ineffective) Status of problem: new problem to this provider Interventions: -- Patient prescribed Wellbutrin XL 300 mg daily  -- Patient prescribed Xanax 1 mg BID PRN by prior PCP; last filled 03/08/24  -- Patient reports only using a few times monthly; if this prescription is renewed would provide limited supply to reflect current usage -- Patient prescribed gabapentin 300 mg qAM/300 mg qAfternoon/600 mg qHS by prior PCP   Patient was given contact information for behavioral health clinic and was instructed to call 911 for emergencies.   Subjective:  Chief Complaint:  Chief Complaint  Patient presents with   Medication Management   New Patient (Initial Visit)    History of Present Illness:    Chart review: -- Referred by PCP for ADHD evaluation. Reported she was diagnosed with ADHD by PCP in 2006.  -- Home psychotropics: Adderall 20 mg BID Wellbutrin XL 300 mg daily Xanax 1 mg BID PRN Gabapentin 300/300/600   Patient reports her new PCP has wanted her to see a psychiatrist for her diagnosis of ADHD. Was diagnosed with ADHD in 2006 by her PCP at [facility] at which point she was started on stimulants. Diagnosis was made clinically; denies  past neuropsych testng. Reports at the time she was  having trouble focusing at work and home. Has been on same stimulant dose for years now and feels it works well.  Has found Adderall to be of significant benefit for focus and attention; able to complete the tasks she needs to complete. Without medication, goes from one thing to another without actually finishing anything. Denies misplacing things. Uses a calendar to keep track of appointments but doesn't feel she overly relies on a calendar.  Looking back to her childhood, feels she likely showed signs/sx of ADHD as a kid. Her mom was an Tourist information centre manager and didn't believe in medicine. Denies difficulty completing her assignments but mom would help keep her on track. Was frequently berated by teachers for excessive talking. Denies trouble staying seated. Got C/Bs in high school; had to take SAT twice. Took over 5 years to complete college. Denies receiving any special assistance in school.   Reports she was diagnosed with depression and anxiety around 2006; same time diagnosis of ADHD was made.   Describes anxiety symptoms as experiencing pit in her stomach, uncontrollable crying. Triggers often include thinking about her dad; stressors with children. Denies persistent anxiety but rather experiences acute episodes of overwhelm that are aborted by Xanax. Has tried other PRN anxiolytics but did not find them effective and feels Xanax has been effective. Using Xanax 1 mg about 3-4 times a month; started in approx. 2017.   Describes depression as feeling sad most of the day accompanied by withdrawing from others, decrease in energy and motivation, decline in energy and motivation, increase in appetite. Denies changes to sleep. Denies ever reaching place of hopelessness or SI.    Longest period this lasted was a few weeks, maybe a month. Has found WBT to be of significant benefit for depressive symptoms; started in approx. 2017.  Describes mood lately as pretty good although mood can be  impacted by pain. Endorses intact energy and motivation although this again is impacted by pain. Sleeping well with about 7 hours nightly. Appetite is stable.   Denies history of mania, AVH.   Shares she recently lost her father in Feb 2024 and has still been processing this. She lives with son and mom lives next door. Reports since this loss, experiences easy tearfulness although not daily. Still able to enjoy activities. Feels that post-menopausal symptoms contribute to emotional sensitivity; reports mild hot flashes, mild vaginal dryness.   She feels that anxiety contributes to inattention but does not fully explain it; even when feeling calm struggles with attention. She doesn't take weekend holidays from stimulant as she feels it helps her carry out day to day activities; otherwise would leave things to the last minute.   Denies any adverse effects to Adderall including chest pain, palpitations, headache, suppression of appetite, insomnia. Denies history of seizures; personal cardiac issues or family history of sudden cardiac death/early cardiac events.  Reviewed process for confirming ADHD diagnosis including recommendation for neuropsychological testing. Amenable to referral. Discussed option to remain connected with psychiatry to assist with interpretation of neuropsych results, management of anxiety/mood, however she declines reporting she already feels burdened by number of doctor's appointments. Saw a psychiatrist/therapist once embedded with her PCP and didn't like it. Declines interest in therapy and identifies she talks to sister for comfort. Prefers to have PCP continue to manage medications pending neuropsych testing.    PDMP: -- Alprazolam 1 mg tablet QTY 60 last filled 03/09/24; rx dating back to July  2023 (ranging from every 1-4 months on average) -- Adderall 20 mg tablet QTY 60 last filled 05/11/24; monthly rx dating back to July 2023 -- Gabapentin 300 mg capsule QTY 540 last filled  4/925; rx dating back to March 2024 -- Hydrocodone-acetaminophen  10-325 mg QTY 90 last filled 05/11/24; rx dating back to July 2023   Past Psychiatric History:  Diagnoses: MDD, GAD, diagnosed with ADHD by PCP in 2006 Medication trials: Adderall, Wellbutrin, Lexapro, gabapentin, Ativan (ineffective) Previous psychiatrist/therapist: denies consistently Hospitalizations: denies Suicide attempts: denies SIB: denies Hx of violence towards others: denies Current access to guns: denies Hx of trauma/abuse: denies  Previous Psychotropic Medications: Yes   Substance Abuse History in the last 12 months:  No.  -- Etoh: approx. 3 beers on Fri and Sat night; perhaps more on vacation  -- Tobacco: quit 2018  -- Cannabis/CBD/THC: denies  -- Denies use of other illicit drugs including unprescribed stimulants/BZDs/opioids, MDMA, hallucinogen  Past Medical History:  Past Medical History:  Diagnosis Date   Anxiety    Arthritis    Complication of anesthesia    DDD (degenerative disc disease), lumbar    Depression    GERD (gastroesophageal reflux disease) 1993   Hyperlipidemia 2024   Peripheral neuropathy    Reflux esophagitis     Past Surgical History:  Procedure Laterality Date   CARPAL TUNNEL RELEASE Right 2010, 09/2017   both   CESAREAN SECTION  1993, 1996   x 2   CHOLECYSTECTOMY  1995   1994   JOINT REPLACEMENT  02/04/24   KNEE ARTHROSCOPY Right 1985, 1987   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   Procedure: TOTAL KNEE ARTHROPLASTY;  Surgeon: Kay Kemps, MD;  Location: [facility];  Service: Orthopedics;  Laterality: Right;  general anesthesia   TRIGGER FINGER RELEASE      Family Psychiatric History:  Mother: on Wellbutrin; depression Sister: anxiety  Family History:  Family History  Problem Relation Age of Onset   Depression Mother    Neuropathy Mother    Vision loss Mother    COPD Father    Osteoarthritis Father    Neuropathy Father    Arthritis Father    Heart disease  Father    Hyperlipidemia Father    Melanoma Sister    Anxiety disorder Sister    Vision loss Maternal Grandmother    Colon cancer Paternal Grandmother    Miscarriages / [place] Daughter     Social History:   Academic/Vocational: works as Water quality scientist at [employer]  Social History   Socioeconomic History   Marital status: Divorced    Spouse name: Not on file   Number of children: 2   Years of education: 16   Highest education level: Bachelor's degree (e.g., BA, AB, BS)  Occupational History    Comment: phlebotomist [employer]  Tobacco Use   Smoking status: Former    Current packs/day: 0.00    Average packs/day: 0.5 packs/day for 30.0 years (15.0 ttl pk-yrs)    Types: Cigarettes    Quit date: 09/22/2017    Years since quitting: 6.7   Smokeless tobacco: Never   Tobacco comments:    Already quit  Vaping Use   Vaping status: Never Used  Substance and Sexual Activity   Alcohol use: Yes    Alcohol/week: 4.0 standard drinks of alcohol    Types: 4 Standard drinks or equivalent per week    Comment: 4-6 beers weekly   Drug use: No   Sexual activity: Not Currently  Birth control/protection: Post-menopausal  Other Topics Concern   Not on file  Social History Narrative   Lives with son   Caffeine- 3 daily, black tea, sodas   Social Drivers of Health   Financial Resource Strain: Medium Risk (02/27/2024)   Overall Financial Resource Strain (CARDIA)    Difficulty of Paying Living Expenses: Somewhat hard  Food Insecurity: Unknown (02/27/2024)   Hunger Vital Sign    Worried About Running Out of Food in the Last Year: Patient declined    Ran Out of Food in the Last Year: Never true  Transportation Needs: No Transportation Needs (02/27/2024)   PRAPARE - Administrator, Civil Service (Medical): No    Lack of Transportation (Non-Medical): No  Physical Activity: Unknown (02/27/2024)   Exercise Vital Sign    Days of Exercise per Week: 0 days    Minutes of Exercise per  Session: Not on file  Stress: No Stress Concern Present (02/27/2024)   [employer] of Occupational Health - Occupational Stress Questionnaire    Feeling of Stress : Only a little  Social Connections: Moderately Isolated (02/27/2024)   Social Connection and Isolation Panel    Frequency of Communication with Friends and Family: More than three times a week    Frequency of Social Gatherings with Friends and Family: Once a week    Attends Religious Services: Never    Database administrator or Organizations: Yes    Attends Banker Meetings: Never    Marital Status: Divorced    Additional Social History: updated  Allergies:  No Known Allergies  Current Medications: Current Outpatient Medications  Medication Sig Dispense Refill   ALPRAZolam (XANAX) 1 MG tablet Take 1 mg by mouth 2 (two) times daily as needed for anxiety.     amphetamine -dextroamphetamine  (ADDERALL) 20 MG tablet Take 1 tablet (20 mg total) by mouth 2 (two) times daily. 60 tablet 0   atorvastatin  (LIPITOR) 20 MG tablet Take 1 tablet (20 mg total) by mouth every evening. 90 tablet 3   buPROPion (WELLBUTRIN XL) 300 MG 24 hr tablet Take 300 mg by mouth in the morning.     celecoxib (CELEBREX) 200 MG capsule Take 200 mg by mouth in the morning.     Cholecalciferol (VITAMIN D-3 PO) Take 1 tablet by mouth at bedtime.     Cyanocobalamin (VITAMIN B-12 PO) Take 1 tablet by mouth daily in the afternoon.     gabapentin (NEURONTIN) 300 MG capsule Take 300-600 mg by mouth See admin instructions. Take 1 capsule (300 mg) by mouth in the morning, take 1 capsule (300 mg) by mouth in the afternoon & take 2 capsules (600 mg) by mouth at night.     Glucosamine-Chondroitin (COSAMIN DS PO) Take 1 tablet by mouth at bedtime.     HYDROcodone-acetaminophen  (NORCO) 10-325 MG tablet Take 0.5-1 tablets by mouth 3 (three) times daily as needed for severe pain (pain score 7-10) or moderate pain (pain score 4-6).     L-LYSINE PO Take 1  tablet by mouth daily in the afternoon.     methocarbamol (ROBAXIN) 750 MG tablet Take 750-1,500 mg by mouth See admin instructions. Take 1 tablet (750 mg) by mouth in the morning, take 1 tablet (750 mg) by mouth in the afternoon & take 2 tablets (1500 mg) by mouth at night.     omeprazole (PRILOSEC) 40 MG capsule Take 40 mg by mouth in the morning.     TURMERIC PO Take 1 capsule by  mouth in the morning, at noon, and at bedtime.     valACYclovir (VALTREX) 500 MG tablet Take 500 mg by mouth in the morning.     No current facility-administered medications for this visit.    ROS: Reports chronic pain related to arthritis  Objective:  Psychiatric Specialty Exam: There were no vitals taken for this visit.There is no height or weight on file to calculate BMI.  General Appearance: Casual and Fairly Groomed  Eye Contact:  Good  Speech:  Clear and Coherent and Normal Rate  Volume:  Normal  Mood:  pretty good  Affect:  Easily tearful when discussing father; otherwise euthymic and calm  Thought Content: Denies AVH; no overt delusional thought content on interview   Suicidal Thoughts:  No  Homicidal Thoughts:  No  Thought Process:  Goal Directed and Linear  Orientation:  Full (Time, Place, and Person)    Memory: Grossly intact   Judgment:  Good  Insight:  Fair  Concentration:  Concentration: intact to interview  Recall:  not formally assessed   Fund of Knowledge: Good  Language: Good  Psychomotor Activity:  Normal  Akathisia:  No  AIMS (if indicated): not done  Assets:  Communication Skills Desire for Improvement Housing Social Support Talents/Skills Transportation Vocational/Educational  ADL's:  Intact  Cognition: WNL  Sleep:  Good   PE: General: sits comfortably in view of camera; no acute distress  Pulm: no increased work of breathing on room air  MSK: all extremity movements appear intact  Neuro: no focal neurological deficits observed  Gait & Station: unable to assess  by video    Metabolic Disorder Labs: Lab Results  Component Value Date   HGBA1C 5.7 (H) 08/04/2023   No results found for: PROLACTIN Lab Results  Component Value Date   CHOL 191 08/04/2023   TRIG 89 08/04/2023   HDL 72 08/04/2023   CHOLHDL 2.7 08/04/2023   LDLCALC 103 (H) 08/04/2023   LDLCALC 109 (H) 08/04/2022   No results found for: TSH  Therapeutic Level Labs: No results found for: LITHIUM No results found for: CBMZ No results found for: VALPROATE  Screenings:  AUDIT    Flowsheet Row Office Visit from 02/28/2024 in [facility]  Alcohol Use Disorder Identification Test Final Score (AUDIT) 3    GAD-7    Flowsheet Row Office Visit from 02/28/2024 in [facility]  Total GAD-7 Score 7   PHQ2-9    Flowsheet Row Office Visit from 02/28/2024 in [facility]  PHQ-2 Total Score 3  PHQ-9 Total Score 9   Flowsheet Row Admission (Discharged) from 02/04/2024 in [facility] from 09/20/2022 in [facility] UC from 07/25/2021 in [facility])  C-SSRS RISK CATEGORY No Risk No Risk No Risk    Collaboration of Care: Collaboration of Care: Primary Care Provider AEB coordination with primary care and Referral or follow-up with counselor/therapist AEB referral for neuropsych testing  Patient/Guardian was advised Release of Information must be obtained prior to any record release in order to collaborate their care with an outside provider. Patient/Guardian was advised if they have not already done so to contact the registration department to sign all necessary forms in order for us  to release information regarding their care.   Consent: Patient/Guardian gives verbal consent for treatment and assignment of benefits for services provided during this  visit. Patient/Guardian  expressed understanding and agreed to proceed.   Televisit via video: I connected with Verneita DELENA Blush on 06/03/24 at  8:00 AM EDT by a video enabled telemedicine application and verified that I am speaking with the correct person using two identifiers.  Location: Patient: home address in [place] Provider: remote office in [place]   I discussed the limitations of evaluation and management by telemedicine and the availability of in person appointments. The patient expressed understanding and agreed to proceed.  I discussed the assessment and treatment plan with the patient. The patient was provided an opportunity to ask questions and all were answered. The patient agreed with the plan and demonstrated an understanding of the instructions.   The patient was advised to call back or seek an in-person evaluation if the symptoms worsen or if the condition fails to improve as anticipated.  I provided 80 minutes dedicated to the care of this patient via video on the date of this encounter to include chart review, face-to-face time with the patient, medication counseling, referral to neuropsych testing, coordination with PCP.  Naria Abbey A Khadeja Abt 6/28/202512:31 PM

## 2024-06-03 ENCOUNTER — Ambulatory Visit (HOSPITAL_BASED_OUTPATIENT_CLINIC_OR_DEPARTMENT_OTHER): Payer: Self-pay | Admitting: Psychiatry

## 2024-06-03 ENCOUNTER — Encounter (HOSPITAL_COMMUNITY): Payer: Self-pay | Admitting: Psychiatry

## 2024-06-03 DIAGNOSIS — F3340 Major depressive disorder, recurrent, in remission, unspecified: Secondary | ICD-10-CM | POA: Diagnosis not present

## 2024-06-03 DIAGNOSIS — F411 Generalized anxiety disorder: Secondary | ICD-10-CM | POA: Diagnosis not present

## 2024-06-03 DIAGNOSIS — F909 Attention-deficit hyperactivity disorder, unspecified type: Secondary | ICD-10-CM | POA: Diagnosis not present

## 2024-06-03 DIAGNOSIS — R41840 Attention and concentration deficit: Secondary | ICD-10-CM

## 2024-06-03 NOTE — Patient Instructions (Addendum)
 Thank you for attending your appointment today.  -- I have placed a referral to [facility] for neuropsychological testing for ADHD. If you do not hear from them in 1 week, please call them at [phone]. -- Continue other medications as prescribed. -- If you or your primary care doctor desire psychiatry involvement in the future, you may reach out to our [facility] in [place] at [phone].  Please do not make any changes to medications without first discussing with your provider. If you are experiencing a psychiatric emergency, please call 911 or present to your nearest emergency department. Additional crisis, medication management, and therapy resources are included below.  [facility]  [address], [place], [place] [address] [phone] WALK-IN URGENT CARE 24/7 FOR ANYONE [address] [place], [place]  [phone] Fax: [phone] [email or web address] *Interpreters available *Accepts all insurance and uninsured for Urgent Care needs *Accepts Medicaid and uninsured for outpatient treatment (below)      ONLY FOR [facility]  Below:    Outpatient New Patient Assessment/Therapy Walk-ins:        Monday, Wednesday, and Thursday 8am until slots are full (first come, first served)                   New Patient Psychiatry/Medication Management        Monday-Friday 8am-11am (first come, first served)               For all walk-ins we ask that you arrive by 7:15am, because patients will be seen in the order of arrival.

## 2024-06-05 ENCOUNTER — Telehealth (INDEPENDENT_AMBULATORY_CARE_PROVIDER_SITE_OTHER): Payer: Self-pay | Admitting: Family

## 2024-06-05 VITALS — BP 124/80 | HR 66 | Ht 66.0 in | Wt 216.0 lb

## 2024-06-05 DIAGNOSIS — R41840 Attention and concentration deficit: Secondary | ICD-10-CM | POA: Insufficient documentation

## 2024-06-05 DIAGNOSIS — L920 Granuloma annulare: Secondary | ICD-10-CM | POA: Diagnosis not present

## 2024-06-05 MED ORDER — AMPHETAMINE-DEXTROAMPHETAMINE 20 MG PO TABS: 20.0000 mg | ORAL_TABLET | Freq: Two times a day (BID) | ORAL | 0 refills | Status: DC

## 2024-06-05 NOTE — Assessment & Plan Note (Signed)
Cont f/u with derm 

## 2024-06-05 NOTE — Progress Notes (Signed)
 Virtual Visit via Video note  I connected with Robin King on 06/05/24 at home by video and verified that I am speaking with the correct person using two identifiers.The provider, Ginger Patrick, FNP is located in their office at time of visit.  I discussed the limitations, risks, security and privacy concerns of performing an evaluation and management service by video and the availability of in person appointments. I also discussed with the patient that there may be a patient responsible charge related to this service. The patient expressed understanding and agreed to proceed.  Subjective: PCP: Patrick Ginger, FNP  Chief Complaint  Patient presents with   Medical Management of Chronic Issues    Follow up on medications     HPI  Here for f/u.   ADD, on adderall 20 mg twice daily. UDS up to date and drug contract utd.  She did establish with a psychiatrist yesterday and is pending an evaluation from an ADD specialist which will be in the next few months.   Pre op in process for left total knee arthroplasty. Surgery isn't until 08/2024. Labs ordered and pended until within 30 days.   Appt with pulmonary July 21st for possible clearance.      ROS: Per HPI  Current Outpatient Medications:    ALPRAZolam (XANAX) 1 MG tablet, Take 1 mg by mouth 2 (two) times daily as needed for anxiety., Disp: , Rfl:    atorvastatin  (LIPITOR) 20 MG tablet, Take 1 tablet (20 mg total) by mouth every evening., Disp: 90 tablet, Rfl: 3   buPROPion (WELLBUTRIN XL) 300 MG 24 hr tablet, Take 300 mg by mouth in the morning., Disp: , Rfl:    celecoxib (CELEBREX) 200 MG capsule, Take 200 mg by mouth in the morning., Disp: , Rfl:    Cholecalciferol (VITAMIN D-3 PO), Take 1 tablet by mouth at bedtime., Disp: , Rfl:    Cyanocobalamin (VITAMIN B-12 PO), Take 1 tablet by mouth daily in the afternoon., Disp: , Rfl:    gabapentin (NEURONTIN) 300 MG capsule, Take 300-600 mg by mouth See admin instructions. Take 1  capsule (300 mg) by mouth in the morning, take 1 capsule (300 mg) by mouth in the afternoon & take 2 capsules (600 mg) by mouth at night., Disp: , Rfl:    Glucosamine-Chondroitin (COSAMIN DS PO), Take 1 tablet by mouth at bedtime., Disp: , Rfl:    HYDROcodone-acetaminophen  (NORCO) 10-325 MG tablet, Take 0.5-1 tablets by mouth 3 (three) times daily as needed for severe pain (pain score 7-10) or moderate pain (pain score 4-6)., Disp: , Rfl:    L-LYSINE PO, Take 1 tablet by mouth daily in the afternoon., Disp: , Rfl:    methocarbamol (ROBAXIN) 750 MG tablet, Take 750-1,500 mg by mouth See admin instructions. Take 1 tablet (750 mg) by mouth in the morning, take 1 tablet (750 mg) by mouth in the afternoon & take 2 tablets (1500 mg) by mouth at night., Disp: , Rfl:    omeprazole (PRILOSEC) 40 MG capsule, Take 40 mg by mouth in the morning., Disp: , Rfl:    TURMERIC PO, Take 1 capsule by mouth in the morning, at noon, and at bedtime., Disp: , Rfl:    valACYclovir (VALTREX) 500 MG tablet, Take 500 mg by mouth in the morning., Disp: , Rfl:    [START ON 07/17/2024] amphetamine -dextroamphetamine  (ADDERALL) 20 MG tablet, Take 1 tablet (20 mg total) by mouth 2 (two) times daily., Disp: 60 tablet, Rfl: 0   [START  ON 06/26/2024] amphetamine -dextroamphetamine  (ADDERALL) 20 MG tablet, Take 1 tablet (20 mg total) by mouth 2 (two) times daily., Disp: 60 tablet, Rfl: 0   amphetamine -dextroamphetamine  (ADDERALL) 20 MG tablet, Take 1 tablet (20 mg total) by mouth 2 (two) times daily., Disp: 60 tablet, Rfl: 0  Observations/Objective: Physical Exam Constitutional:      General: She is not in acute distress.    Appearance: Normal appearance. She is not ill-appearing.  Pulmonary:     Effort: Pulmonary effort is normal.   Neurological:     General: No focal deficit present.     Mental Status: She is alert and oriented to person, place, and time.   Psychiatric:        Mood and Affect: Mood normal.        Behavior:  Behavior normal.        Thought Content: Thought content normal.     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  Refill adderall 20 mg bid  Pt to f/u with neuropsych testing for ADD specifics.  Will fill until evaluated.   Orders: -     Amphetamine -Dextroamphetamine ; Take 1 tablet (20 mg total) by mouth 2 (two) times daily.  Dispense: 60 tablet; Refill: 0 -     Amphetamine -Dextroamphetamine ; Take 1 tablet (20 mg total) by mouth 2 (two) times daily.  Dispense: 60 tablet; Refill: 0 -     Amphetamine -Dextroamphetamine ; Take 1 tablet (20 mg total) by mouth 2 (two) times daily.  Dispense: 60 tablet; Refill: 0    Follow Up Instructions: Return in about 3 months (around 09/05/2024) for f/u ADD medication.   I discussed the assessment and treatment plan with the patient. The patient was provided an opportunity to ask questions and all were answered. The patient agreed with the plan and demonstrated an understanding of the instructions.   The patient was advised to call back or seek an in-person evaluation if the symptoms worsen or if the condition fails to improve as anticipated.  The above assessment and management plan was discussed with the patient. The patient verbalized understanding of and has agreed to the management plan. Patient is aware to call the clinic if symptoms persist or worsen. Patient is aware when to return to the clinic for a follow-up visit. Patient educated on when it is appropriate to go to the emergency department.     Ginger Patrick, MSN, APRN, FNP-C [facility]

## 2024-06-05 NOTE — Assessment & Plan Note (Signed)
 Pdmp reviewed Opioid contract and uds up to date.  Refill adderall 20 mg bid  Pt to f/u with neuropsych testing for ADD specifics.  Will fill until evaluated.

## 2024-06-20 ENCOUNTER — Encounter: Payer: Self-pay | Admitting: Family

## 2024-06-20 DIAGNOSIS — F3340 Major depressive disorder, recurrent, in remission, unspecified: Secondary | ICD-10-CM

## 2024-06-20 MED ORDER — ALPRAZOLAM 1 MG PO TABS: ORAL_TABLET | ORAL | 1 refills | Status: AC

## 2024-06-20 NOTE — Addendum Note (Signed)
 Addended by: CORWIN ANTU on: 06/20/2024 01:17 PM   Modules accepted: Orders

## 2024-06-26 ENCOUNTER — Ambulatory Visit: Admitting: Pulmonary Disease

## 2024-06-26 ENCOUNTER — Encounter: Payer: Self-pay | Admitting: Pulmonary Disease

## 2024-06-26 ENCOUNTER — Encounter: Payer: Self-pay | Admitting: Family

## 2024-06-26 VITALS — BP 126/72 | HR 64 | Ht 66.0 in | Wt 216.0 lb

## 2024-06-26 DIAGNOSIS — F900 Attention-deficit hyperactivity disorder, predominantly inattentive type: Secondary | ICD-10-CM | POA: Diagnosis not present

## 2024-06-26 DIAGNOSIS — J309 Allergic rhinitis, unspecified: Secondary | ICD-10-CM | POA: Insufficient documentation

## 2024-06-26 DIAGNOSIS — D3610 Benign neoplasm of peripheral nerves and autonomic nervous system, unspecified: Secondary | ICD-10-CM

## 2024-06-26 DIAGNOSIS — M9320 Osteochondritis dissecans of unspecified site: Secondary | ICD-10-CM | POA: Insufficient documentation

## 2024-06-26 DIAGNOSIS — K219 Gastro-esophageal reflux disease without esophagitis: Secondary | ICD-10-CM | POA: Insufficient documentation

## 2024-06-26 DIAGNOSIS — Z01811 Encounter for preprocedural respiratory examination: Secondary | ICD-10-CM

## 2024-06-26 DIAGNOSIS — F419 Anxiety disorder, unspecified: Secondary | ICD-10-CM | POA: Insufficient documentation

## 2024-06-26 NOTE — Patient Instructions (Addendum)
 VISIT SUMMARY:  Today, we discussed your pulmonary hypertension and a benign lung tumor. You are not experiencing any symptoms such as shortness of breath, chest pain, or palpitations. You quit smoking in 2018 and have no current respiratory symptoms. We reviewed your current medication, Lipitor.  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 Your tumor has remained stable over the past four months. We will follow up with a lung cancer screening scan in September to monitor it.  -PULMONARY HYPERTENSION: Pulmonary hypertension is high blood pressure in the arteries of the lungs. Although a previous CT scan raised concerns, a cardiologist found no significant issues. You are currently asymptomatic.  Follow-up cardiac CT did not show any significant issues with the pulmonary artery caliber.  An echocardiogram could be ordered to further evaluate this condition however, you are asymptomatic and wished to defer this at present.  Will reassess after lung cancer screening CT in September.  Pulmonary artery size on CT scan can be helpful however this is not perfectly sensitive not the best indicator of pulmonary hypertension.  On cardiac scoring CT the pulmonary artery did not appear to be overly enlarged.  -TOBACCO USE: You quit smoking in 2018 and previously smoked less than half a pack a day. You have no current respiratory symptoms, which is a positive sign for your lung health.  -PULMONARY CLEARANCE FOR SURGERY: You are considered low risk for the proposed procedure on your knee.   INSTRUCTIONS:  Follow up with a lung cancer screening scan in September to monitor the benign schwannoma.  Will see you an as-needed basis.

## 2024-06-26 NOTE — Progress Notes (Signed)
 Subjective:    Patient ID: Robin King, female    DOB: [birth year], 58 y.o.   MRN: [id number]  Patient Care Team: Robin Antu, FNP as PCP - General (Family Medicine)  Chief Complaint  Patient presents with   Establish Care    Surgical Clearance - knee surgery, 08/21/24; CT 2024 review    BACKGROUND: Patient presents for preoperative respiratory exam.  She is kindly referred by King Corwin, FNP.  HPI Discussed the use of AI scribe software for clinical note transcription with the patient, who gave verbal consent to proceed.  History of Present Illness   Robin King is a 58 year old female who presents for evaluation of pulmonary hypertension and possible lung (pleural) tumor.  A previous CT scan raised concerns about pulmonary hypertension, but a cardiologist was consulted and found no cause for concern.  The diagnosis was based solely on pulmonary artery caliber measurements on lung cancer screening CT of 23 August 2023.  These were not reproducible on the follow-up cardiac scoring CT of 27 December 2023.  She experiences no symptoms such as shortness of breath, chest pain, or palpitations. She quit smoking in 2018, having previously smoked less than half a pack a day.  She underwent total knee arthroplasty on the right on 04 February 2024 without incident.  The September CT scan identified a potential nerve sheath tumor that has remained stable over a four-month period. This was discovered during a lung cancer screening scan. She is currently on Lipitor, though the specific dose and frequency were not discussed.  No shortness of breath, chest pain, palpitations, or sleep problems such as snoring.  Sleep is restorative.  No cough or sputum production.  No hemoptysis.  No orthopnea or paroxysmal nocturnal dyspnea.  No lower extremity edema.  She is employed by [employer].  No significant occupational history.  No prior use of anorexiants.  Does use Adderall for ADHD.     Review of Systems A 10 point review of systems was performed and it is as noted above otherwise negative.   Past Medical History:  Diagnosis Date   Anxiety    Arthritis    Complication of anesthesia    DDD (degenerative disc disease), lumbar    Depression    GERD (gastroesophageal reflux disease) 1993   Hyperlipidemia 2024   Peripheral neuropathy    Reflux esophagitis     Past Surgical History:  Procedure Laterality Date   CARPAL TUNNEL RELEASE Right 2010, 09/2017   both   CESAREAN SECTION  1993, 1996   x 2   CHOLECYSTECTOMY  1995   1994   JOINT REPLACEMENT  02/04/24   KNEE ARTHROSCOPY Right 1985, 1987   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   Procedure: TOTAL KNEE ARTHROPLASTY;  Surgeon: Kay Kemps, MD;  Location: [facility];  Service: Orthopedics;  Laterality: Right;  general anesthesia   TRIGGER FINGER RELEASE      Patient Active Problem List   Diagnosis Date Noted   Allergic rhinitis 06/26/2024   Anxiety 06/26/2024   Attention deficit hyperactivity disorder, predominantly inattentive type 06/26/2024   Gastroesophageal reflux disease 06/26/2024   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/26/2024   Granuloma annulare 06/05/2024   Attention or concentration deficit 06/05/2024   MDD (recurrent major depressive disorder) in remission (HCC) 06/03/2024   Pulmonary emphysema (HCC) 05/15/2024   Aortic atherosclerosis (HCC) 05/15/2024   Pulmonary arterial hypertension (HCC) 05/15/2024   History of right knee joint replacement 02/28/2024   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  Vitamin D deficiency 02/28/2024   Mixed hyperlipidemia 02/28/2024   Gastroesophageal reflux disease with esophagitis without hemorrhage 02/28/2024   Prediabetes 02/28/2024   Inattention 02/28/2024   High risk medication use 02/28/2024   Coronary artery calcification seen on CT scan 11/15/2023   Osteoarthritis of both knees 04/15/2023   Cervical radiculopathy 05/07/2021   Spondylosis of cervical spine 05/07/2021    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0 years (15.0 ttl pk-yrs)    Types: Cigarettes    Quit date: 09/22/2017    Years since quitting: 6.7   Smokeless tobacco: Never   Tobacco comments:    Already quit  Substance Use Topics   Alcohol use: Yes    Alcohol/week: 4.0 standard drinks of alcohol    Types: 4 Standard drinks or equivalent per week    Comment: 4-6 beers weekly    No Known Allergies  Current Meds  Medication Sig   ALPRAZolam  (XANAX ) 1 MG tablet Take 1-2 tablets once daily prn anxiety   [START ON 07/17/2024] amphetamine -dextroamphetamine  (ADDERALL) 20 MG tablet Take 1 tablet (20 mg total) by mouth 2 (two) times daily.   amphetamine -dextroamphetamine  (ADDERALL) 20 MG tablet Take 1 tablet (20 mg total) by mouth 2 (two) times daily.   amphetamine -dextroamphetamine  (ADDERALL) 20 MG tablet Take 1 tablet (20 mg total) by mouth 2 (two) times daily.   atorvastatin  (LIPITOR) 20 MG tablet Take 1 tablet (20 mg total) by mouth every evening.   buPROPion (WELLBUTRIN XL) 300 MG 24 hr tablet Take 300 mg by mouth in the morning.   celecoxib (CELEBREX) 200 MG capsule Take 200 mg by mouth in the morning.   Cholecalciferol (VITAMIN D-3 PO) Take 1 tablet by mouth at bedtime.   Cyanocobalamin (VITAMIN B-12 PO) Take 1 tablet by mouth daily in the afternoon.    gabapentin (NEURONTIN) 300 MG capsule Take 300-600 mg by mouth See admin instructions. Take 1 capsule (300 mg) by mouth in the morning, take 1 capsule (300 mg) by mouth in the afternoon & take 2 capsules (600 mg) by mouth at night.   Glucosamine-Chondroitin (COSAMIN DS PO) Take 1 tablet by mouth at bedtime.   HYDROcodone-acetaminophen  (NORCO) 10-325 MG tablet Take 0.5-1 tablets by mouth 3 (three) times daily as needed for severe pain (pain score 7-10) or moderate pain (pain score 4-6).   L-LYSINE PO Take 1 tablet by mouth daily in the afternoon.   methocarbamol (ROBAXIN) 750 MG tablet Take 750-1,500 mg by mouth See admin instructions. Take 1 tablet (750 mg) by mouth in the morning, take 1 tablet (750 mg) by mouth in the afternoon & take 2 tablets (1500 mg) by mouth at night.   omeprazole (PRILOSEC) 40 MG capsule Take 40 mg by mouth in  the morning.   TURMERIC PO Take 1 capsule by mouth in the morning, at noon, and at bedtime.   valACYclovir (VALTREX) 500 MG tablet Take 500 mg by mouth in the morning.    Immunization History  Administered Date(s) Administered   Influenza, Seasonal, Injecte, Preservative Fre 09/09/2023   Influenza,inj,Quad PF,6+ Mos 10/12/2019, 09/06/2020, 10/09/2021, 09/10/2022   Influenza,inj,quad, With Preservative 09/29/2017        Objective:     BP 126/72 (BP Location: Left Arm, Patient Position: Sitting, Cuff Size: Normal)   Pulse 64   Ht 5' 6 (1.676 m)   Wt 216 lb (98 kg)   SpO2 98%   BMI 34.86 kg/m   SpO2: 98 %  GENERAL: Overweight, well-developed woman, no acute distress, fully ambulatory.  No conversational dyspnea. HEAD: Normocephalic, atraumatic.  EYES: Pupils equal, round, reactive to light.  No scleral icterus.  MOUTH: Dentition intact, oral mucosa moist, no thrush. NECK: Supple. No thyromegaly. Trachea midline. No JVD.  No adenopathy. PULMONARY: Good air entry bilaterally.  No adventitious sounds. CARDIOVASCULAR: S1 and S2. Regular rate and rhythm.   No rubs, murmurs or gallops heard. ABDOMEN: Obese, otherwise benign. MUSCULOSKELETAL: No joint deformity, no clubbing, no edema.  NEUROLOGIC: No overt focal deficit, no gait disturbance, speech is fluent. SKIN: Intact,warm,dry. PSYCH: Mood and behavior normal.   ARISCAT score: 19 points, low risk for in-hospital postop pulmonary complications.    Imaging reviewed independently, in particular CT from 23 August 2023 and cardiac scoring CT from 27 December 2023.  Assessment & Plan:     ICD-10-CM   1. Preop pulmonary/respiratory exam  Z01.811     2. Attention deficit hyperactivity disorder, predominantly inattentive type  F90.0      Assessment and Plan  Preoperative respiratory examination Patient's coronary risk score is low.  Currently asymptomatic.  She is not on pulmonary medications and is not limited by shortness of breath.  No cough or sputum production on a daily basis.  Patient low risk from the pulmonary standpoint.    Pulmonary hypertension Concern for pulmonary hypertension based on a previous CT scan. Cardiac gated CT showed no significant vascular issues. She is asymptomatic with no shortness of breath or palpitations, and is hemodynamically stable with normal blood pressure.  Pulmonary artery size on CT scan can be helpful however this is not perfectly sensitive not the best indicator of pulmonary hypertension.  On cardiac scoring CT the pulmonary artery did not appear to be overly enlarged. - If concerned, may order echocardiogram to further evaluate for pulmonary hypertension however, the patient has no symptoms related to this. - She is on Adderall which has not been - Reassess after the September lung cancer screening scan if pulmonary hypertension remains a concern.  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 The tumor has remained unchanged in size over four months, indicating  stability and benign nature. - Follow up with lung cancer screening scan in September to monitor.  Tobacco use She quit smoking in 2018. Previously smoked less than half a pack a day, with increased use on weekends. Denies current smoking and reports no respiratory symptoms.      Advised if symptoms do not improve or worsen, to please contact office for sooner follow up or seek emergency care.    I spent 45 minutes of dedicated to the care of this patient on the date of this encounter to include pre-visit review of  records, face-to-face time with the patient discussing conditions above, post visit ordering of testing, clinical documentation with the electronic health record, making appropriate referrals as documented, and communicating necessary findings to members of the patients care team.   C. Leita Sanders, MD Advanced Bronchoscopy PCCM [facility]    *This note was dictated using voice recognition software/Dragon.  Despite best efforts to proofread, errors can occur which can change the meaning. Any transcriptional errors that result from this process are unintentional and may not be fully corrected at the time of dictation.

## 2024-07-10 ENCOUNTER — Encounter: Payer: Self-pay | Admitting: Family

## 2024-07-19 ENCOUNTER — Ambulatory Visit: Admitting: Family

## 2024-07-19 ENCOUNTER — Encounter: Payer: Self-pay | Admitting: Family

## 2024-07-19 VITALS — BP 152/88 | HR 90 | Temp 99.0°F | Ht 66.0 in | Wt 215.4 lb

## 2024-07-19 DIAGNOSIS — F411 Generalized anxiety disorder: Secondary | ICD-10-CM | POA: Diagnosis not present

## 2024-07-19 DIAGNOSIS — F331 Major depressive disorder, recurrent, moderate: Secondary | ICD-10-CM | POA: Insufficient documentation

## 2024-07-19 DIAGNOSIS — F900 Attention-deficit hyperactivity disorder, predominantly inattentive type: Secondary | ICD-10-CM | POA: Diagnosis not present

## 2024-07-19 DIAGNOSIS — Z638 Other specified problems related to primary support group: Secondary | ICD-10-CM | POA: Insufficient documentation

## 2024-07-19 MED ORDER — SERTRALINE HCL 50 MG PO TABS
50.0000 mg | ORAL_TABLET | Freq: Every day | ORAL | 3 refills | Status: DC
Start: 2024-07-19 — End: 2024-08-03

## 2024-07-19 NOTE — Patient Instructions (Addendum)
   Start sertraline  50 mg ,1/2 mg for anxiety and depression. Take 1/2 tablet by mouth once daily for about one week, then increase to 1 full tablet thereafter.   Taking the medicine as directed and not missing any doses is one of the best things you can do to treat your anxiety/depression.  Here are some things to keep in mind:  Side effects (stomach upset, some increased anxiety) may happen before you notice a benefit.  These side effects typically go away over time. Changes to your dose of medicine or a change in medication all together is sometimes necessary Many people will notice an improvement within two weeks but the full effect of the medication can take up to 4-6 weeks Stopping the medication when you start feeling better often results in a return of symptoms. Most people need to be on medication at least 6-12 months If you start having thoughts of hurting yourself or others after starting this medicine, please call me immediately.    ------------------------------------

## 2024-07-19 NOTE — Progress Notes (Signed)
 Established Patient Office Visit  Subjective:      CC:  Chief Complaint  Patient presents with   Medical Management of Chronic Issues    HPI: Robin King is a 58 y.o. female presenting on 07/19/2024 for Medical Management of Chronic Issues .  Discussed the use of AI scribe software for clinical note transcription with the patient, who gave verbal consent to proceed.  History of Present Illness Robin King is a 58 year old female who presents for medication management and follow-up for depression and anxiety.  She has a history of depression and anxiety, previously managed with Lexapro, which she discontinued due to sexual side effects despite its efficacy in improving her mood and anxiety. She states there was no improvement with her sexual side effects once d/c the lexapro. Currently, she is on Wellbutrin (bupropion) 300 mg daily for depression.  She is also prescribed Adderall for ADD, alprazolam  (Xanax ) as needed for anxiety, gabapentin for pain related to scoliosis and levoscoliosis, and hydrocodone-acetaminophen  for pain management. She uses alprazolam  approximately once or twice a week, particularly during stressful family interactions and financial concerns.  Significant stress has arisen from the recent death of her father, who was her primary support. She resides next to her father's property and is involved in family disputes. Her   No suicidal thoughts and she is sleeping well. She has a history of knee surgery, which has contributed to financial strain due to high medical expenses. She is currently covered by [facility] for her knee issues, which assists in managing her medical appointments.         Social history:  Relevant past medical, surgical, family and social history reviewed and updated as indicated. Interim medical history since our last visit reviewed.  Allergies and medications reviewed and updated.  DATA REVIEWED: CHART IN  EPIC     ROS: Negative unless specifically indicated above in HPI.    Current Outpatient Medications:    ALPRAZolam  (XANAX ) 1 MG tablet, Take 1-2 tablets once daily prn anxiety, Disp: 20 tablet, Rfl: 1   amphetamine -dextroamphetamine  (ADDERALL) 20 MG tablet, Take 1 tablet (20 mg total) by mouth 2 (two) times daily., Disp: 60 tablet, Rfl: 0   amphetamine -dextroamphetamine  (ADDERALL) 20 MG tablet, Take 1 tablet (20 mg total) by mouth 2 (two) times daily., Disp: 60 tablet, Rfl: 0   amphetamine -dextroamphetamine  (ADDERALL) 20 MG tablet, Take 1 tablet (20 mg total) by mouth 2 (two) times daily., Disp: 60 tablet, Rfl: 0   atorvastatin  (LIPITOR) 20 MG tablet, Take 1 tablet (20 mg total) by mouth every evening., Disp: 90 tablet, Rfl: 3   buPROPion (WELLBUTRIN XL) 300 MG 24 hr tablet, Take 300 mg by mouth in the morning., Disp: , Rfl:    celecoxib (CELEBREX) 200 MG capsule, Take 200 mg by mouth in the morning., Disp: , Rfl:    Cholecalciferol (VITAMIN D-3 PO), Take 1 tablet by mouth at bedtime., Disp: , Rfl:    Cyanocobalamin (VITAMIN B-12 PO), Take 1 tablet by mouth daily in the afternoon., Disp: , Rfl:    gabapentin (NEURONTIN) 300 MG capsule, Take 300-600 mg by mouth See admin instructions. Take 1 capsule (300 mg) by mouth in the morning, take 1 capsule (300 mg) by mouth in the afternoon & take 2 capsules (600 mg) by mouth at night., Disp: , Rfl:    Glucosamine-Chondroitin (COSAMIN DS PO), Take 1 tablet by mouth at bedtime., Disp: , Rfl:    HYDROcodone-acetaminophen  (NORCO) 10-325 MG tablet,  Take 0.5-1 tablets by mouth 3 (three) times daily as needed for severe pain (pain score 7-10) or moderate pain (pain score 4-6)., Disp: , Rfl:    L-LYSINE PO, Take 1 tablet by mouth daily in the afternoon., Disp: , Rfl:    methocarbamol (ROBAXIN) 750 MG tablet, Take 750-1,500 mg by mouth See admin instructions. Take 1 tablet (750 mg) by mouth in the morning, take 1 tablet (750 mg) by mouth in the afternoon & take  2 tablets (1500 mg) by mouth at night., Disp: , Rfl:    omeprazole (PRILOSEC) 40 MG capsule, Take 40 mg by mouth in the morning., Disp: , Rfl:    sertraline  (ZOLOFT ) 50 MG tablet, Take 1 tablet (50 mg total) by mouth daily., Disp: 30 tablet, Rfl: 3   TURMERIC PO, Take 1 capsule by mouth in the morning, at noon, and at bedtime., Disp: , Rfl:    valACYclovir (VALTREX) 500 MG tablet, Take 500 mg by mouth in the morning., Disp: , Rfl:         Objective:        BP (!) 152/88 (BP Location: Right Arm, Patient Position: Sitting, Cuff Size: Normal)   Pulse 90   Temp 99 F (37.2 C) (Temporal)   Ht 5' 6 (1.676 m)   Wt 215 lb 7.2 oz (97.7 kg)   SpO2 99%   BMI 34.77 kg/m   Physical Exam   Wt Readings from Last 3 Encounters:  07/19/24 215 lb 7.2 oz (97.7 kg)  06/26/24 216 lb (98 kg)  06/05/24 216 lb (98 kg)    Physical Exam Vitals reviewed.  Constitutional:      General: She is not in acute distress.    Appearance: Normal appearance. She is normal weight. She is not ill-appearing, toxic-appearing or diaphoretic.  HENT:     Head: Normocephalic.  Cardiovascular:     Rate and Rhythm: Normal rate.  Pulmonary:     Effort: Pulmonary effort is normal.  Musculoskeletal:        General: Normal range of motion.  Neurological:     General: No focal deficit present.     Mental Status: She is alert and oriented to person, place, and time. Mental status is at baseline.  Psychiatric:        Attention and Perception: Attention and perception normal.        Mood and Affect: Mood is depressed. Affect is tearful.        Behavior: Behavior normal. Behavior is not agitated, aggressive or hyperactive. Behavior is cooperative.        Thought Content: Thought content normal. Thought content does not include homicidal or suicidal ideation. Thought content does not include homicidal or suicidal plan.        Judgment: Judgment normal.          Results   Assessment & Plan:   Assessment and  Plan Assessment & Plan Major depressive disorder and generalized anxiety disorder Major depressive disorder and generalized anxiety disorder with increased symptoms after discontinuing Lexapro due to sexual side effects. Symptoms were better managed on Lexapro. Currently on bupropion, which has been helpful. Significant life stressors, including the recent loss of her father and family conflicts, may be exacerbating symptoms. Uses alprazolam  as needed, approximately once or twice a week. Open to trying sertraline , which may be more effective for agitation and irritability than Lexapro. Potential side effects of sertraline  include diarrhea, nausea, and headaches, which are usually transient. - Start sertraline  at a  low dose, half tablet for a week, then increase to 50 mg daily. - Monitor for side effects such as diarrhea, nausea, and headaches. - Continue bupropion 300 mg daily. - Use alprazolam  as needed for acute anxiety. - Encouraged to reach out if experiencing any suicidal thoughts or adverse effects from medication.  Recording duration: 23 minutes      Return in about 6 weeks (around 08/30/2024) for f/u depression.     Ginger Patrick, MSN, APRN, FNP-C [facility]

## 2024-07-21 ENCOUNTER — Other Ambulatory Visit

## 2024-07-22 LAB — URINALYSIS, ROUTINE W REFLEX MICROSCOPIC
Bilirubin, UA: NEGATIVE
Glucose, UA: NEGATIVE
Ketones, UA: NEGATIVE
Leukocytes,UA: NEGATIVE
Nitrite, UA: NEGATIVE
Protein,UA: NEGATIVE
RBC, UA: NEGATIVE
Specific Gravity, UA: 1.012 (ref 1.005–1.030)
Urobilinogen, Ur: 0.2 mg/dL (ref 0.2–1.0)
pH, UA: 7 (ref 5.0–7.5)

## 2024-07-22 LAB — HEMOGLOBIN A1C
Est. average glucose Bld gHb Est-mCnc: 108 mg/dL
Hgb A1c MFr Bld: 5.4 % (ref 4.8–5.6)

## 2024-07-22 LAB — COMPREHENSIVE METABOLIC PANEL WITH GFR
ALT: 19 [IU]/L (ref 0–32)
AST: 22 [IU]/L (ref 0–40)
Albumin: 4.6 g/dL (ref 3.8–4.9)
Alkaline Phosphatase: 84 [IU]/L (ref 44–121)
BUN/Creatinine Ratio: 17 (ref 9–23)
BUN: 13 mg/dL (ref 6–24)
Bilirubin Total: 0.3 mg/dL (ref 0.0–1.2)
CO2: 21 mmol/L (ref 20–29)
Calcium: 9.4 mg/dL (ref 8.7–10.2)
Chloride: 104 mmol/L (ref 96–106)
Creatinine, Ser: 0.78 mg/dL (ref 0.57–1.00)
Globulin, Total: 2 g/dL (ref 1.5–4.5)
Glucose: 86 mg/dL (ref 70–99)
Potassium: 5 mmol/L (ref 3.5–5.2)
Sodium: 142 mmol/L (ref 134–144)
Total Protein: 6.6 g/dL (ref 6.0–8.5)
eGFR: 89 mL/min/{1.73_m2}

## 2024-07-22 LAB — CBC
Hematocrit: 43.2 % (ref 34.0–46.6)
Hemoglobin: 13.8 g/dL (ref 11.1–15.9)
MCH: 32.7 pg (ref 26.6–33.0)
MCHC: 31.9 g/dL (ref 31.5–35.7)
MCV: 102 fL — ABNORMAL HIGH (ref 79–97)
Platelets: 293 x10E3/uL (ref 150–450)
RBC: 4.22 x10E6/uL (ref 3.77–5.28)
RDW: 13.4 % (ref 11.7–15.4)
WBC: 7.8 x10E3/uL (ref 3.4–10.8)

## 2024-07-22 LAB — PROTIME-INR
INR: 1 (ref 0.9–1.2)
Prothrombin Time: 10.7 s (ref 9.1–12.0)

## 2024-07-22 LAB — HCG, SERUM, QUALITATIVE: hCG,Beta Subunit,Qual,Serum: NEGATIVE m[IU]/mL

## 2024-07-23 ENCOUNTER — Encounter: Payer: Self-pay | Admitting: Family

## 2024-07-23 LAB — URINE CULTURE

## 2024-07-24 ENCOUNTER — Ambulatory Visit: Payer: Self-pay | Admitting: Family

## 2024-07-27 NOTE — Telephone Encounter (Signed)
 Manuelita can you send [facility] a copy of the pre op clearance, and just confirm it has been faxed.

## 2024-07-31 NOTE — H&P (Signed)
 TOTAL KNEE ADMISSION H&P  Patient is being admitted for left total knee arthroplasty.  Subjective:  Chief Complaint: Left knee pain.  HPI: Robin King, 58 y.o. female has a history of pain and functional disability in the left knee due to arthritis and has failed non-surgical conservative treatments for greater than 12 weeks to include NSAID's and/or analgesics, corticosteriod injections, viscosupplementation injections, and activity modification. Onset of symptoms was gradual, starting several years ago with gradually worsening course since that time. The patient noted no past surgery on the left knee.  Patient currently rates pain in the left knee at 7 out of 10 with activity. Patient has worsening of pain with activity and weight bearing, pain that interferes with activities of daily living, and crepitus. Patient has evidence of periarticular osteophytes and joint space narrowing by imaging studies.  There is no active infection.  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   MDD (recurrent major depressive disorder) in remission (HCC) 06/03/2024   Pulmonary emphysema (HCC) 05/15/2024   Aortic atherosclerosis (HCC) 05/15/2024   Pulmonary arterial hypertension (HCC) 05/15/2024   History of right knee joint replacement 02/28/2024   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   Vitamin D deficiency 02/28/2024   Mixed hyperlipidemia 02/28/2024   Gastroesophageal reflux disease with esophagitis without hemorrhage 02/28/2024   Prediabetes 02/28/2024   Inattention 02/28/2024   High risk  medication use 02/28/2024   Coronary artery calcification seen on CT scan 11/15/2023   Osteoarthritis of both knees 04/15/2023   Cervical radiculopathy 05/07/2021   Spondylosis of cervical spine 05/07/2021   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   Chronic neck pain 12/18/2017   Bilateral carpal tunnel syndrome 09/09/2017    Past Medical History:  Diagnosis Date   Anxiety    Arthritis    Complication of anesthesia    DDD (degenerative disc disease), lumbar    Depression    GERD (gastroesophageal reflux disease) 1993   Hyperlipidemia 2024   Peripheral neuropathy    Reflux esophagitis     Past Surgical History:  Procedure Laterality Date   CARPAL TUNNEL RELEASE Right 2010, 09/2017   both   CESAREAN SECTION  1993, 1996   x 2   CHOLECYSTECTOMY  1995   1994   JOINT REPLACEMENT  02/04/24   KNEE ARTHROSCOPY Right 1985, 1987   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   Procedure: TOTAL KNEE ARTHROPLASTY;  Surgeon: Kay Kemps, MD;  Location: [facility];  Service: Orthopedics;  Laterality: Right;  general anesthesia   TRIGGER FINGER RELEASE      Prior to Admission medications   Medication Sig Start Date End Date Taking? Authorizing Provider  ALPRAZolam  (XANAX ) 1 MG tablet Take 1-2 tablets once daily prn anxiety 06/20/24   Dugal, Tabitha, FNP  amphetamine -dextroamphetamine  (ADDERALL) 20 MG tablet Take 1 tablet (20 mg total) by mouth 2 (two) times daily. 07/17/24 08/16/24  Corwin Antu, FNP  amphetamine -dextroamphetamine  (ADDERALL) 20 MG tablet Take 1 tablet (20 mg total) by mouth 2 (two) times daily. 06/26/24 07/26/24  Corwin Antu, FNP  amphetamine -dextroamphetamine  (ADDERALL) 20 MG tablet Take 1 tablet (20 mg total) by mouth 2 (two) times daily. 06/05/24 07/19/24  Corwin Antu, FNP  atorvastatin  (LIPITOR) 20 MG tablet Take 1 tablet (20 mg total) by mouth every evening. 02/02/24   Court Dorn PARAS, MD  buPROPion (WELLBUTRIN XL) 300 MG 24 hr tablet Take 300 mg by  mouth in the morning. 06/30/17   [provider]  celecoxib (CELEBREX) 200 MG capsule Take 200 mg by mouth in the morning.    [provider]  Cholecalciferol (VITAMIN D-3 PO) Take 1 tablet by mouth at bedtime.    [provider]  Cyanocobalamin (VITAMIN B-12 PO) Take 1 tablet by mouth daily in the afternoon.    [provider]  gabapentin (NEURONTIN) 300 MG capsule Take 300-600 mg by mouth See admin instructions. Take 1 capsule (300 mg) by mouth in the morning, take 1 capsule (300 mg) by mouth in the afternoon & take 2 capsules (600 mg) by mouth at night.    [provider]  Glucosamine-Chondroitin (COSAMIN DS PO) Take 1 tablet by mouth at bedtime.    [provider]  HYDROcodone-acetaminophen  (NORCO) 10-325 MG tablet Take 0.5-1 tablets by mouth 3 (three) times daily as needed for severe pain (pain score 7-10) or moderate pain (pain score 4-6). 05/11/24   [provider]  L-LYSINE PO Take 1 tablet by mouth daily in the afternoon.    [provider]  methocarbamol (ROBAXIN) 750 MG tablet Take 750-1,500 mg by mouth See admin instructions. Take 1 tablet (750 mg) by mouth in the morning, take 1 tablet (750 mg) by mouth in the afternoon & take 2 tablets (1500 mg) by mouth at night.    [provider]  omeprazole (PRILOSEC) 40 MG capsule Take 40 mg by mouth in the morning. 09/05/17   [provider]  sertraline  (ZOLOFT ) 50 MG tablet Take 1 tablet (50 mg total) by mouth daily. 07/19/24   Dugal, Tabitha, FNP  TURMERIC PO Take 1 capsule by mouth in the morning, at noon, and at bedtime.    [provider]  valACYclovir (VALTREX) 500 MG tablet Take 500 mg by mouth in the morning.    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[employer]  Tobacco Use   Smoking status: Former    Current packs/day: 0.00    Average packs/day: 0.5 packs/day for 30.0 years (15.0 ttl pk-yrs)    Types: Cigarettes    Quit date: 09/22/2017    Years since quitting: 6.8   Smokeless tobacco: Never   Tobacco comments:    Already quit  Vaping Use   Vaping status: Never Used  Substance and Sexual Activity   Alcohol use: Yes    Alcohol/week: 4.0 standard drinks of alcohol    Types: 4 Standard drinks or equivalent per week    Comment: 4-6 beers weekly   Drug use: No   Sexual activity: Not Currently    Birth control/protection: Post-menopausal  Other Topics Concern   Not on file  Social History Narrative   Lives with son   Caffeine- 3 daily, black tea, sodas   Social Drivers of Corporate investment banker Strain:  Medium Risk (02/27/2024)   Overall Financial Resource Strain (CARDIA)    Difficulty of Paying Living Expenses: Somewhat hard  Food Insecurity: Unknown (02/27/2024)   Hunger Vital Sign    Worried About Running Out of Food in the Last Year: Patient declined    Ran Out of Food in the Last Year: Never true  Transportation Needs: No Transportation Needs (02/27/2024)   PRAPARE - Administrator, Civil Service (Medical): No    Lack of Transportation (Non-Medical): No  Physical Activity: Unknown (02/27/2024)   Exercise Vital Sign    Days of Exercise per Week: 0 days    Minutes of Exercise per Session: Not on file  Stress: No Stress Concern Present (02/27/2024)   [employer] of Occupational Health - Occupational Stress Questionnaire    Feeling of Stress : Only a little  Social Connections: Moderately Isolated (02/27/2024)   Social Connection and Isolation Panel    Frequency of Communication with Friends and Family: More than three times a week     Frequency of Social Gatherings with Friends and Family: Once a week    Attends Religious Services: Never    Database administrator or Organizations: Yes    Attends Banker Meetings: Never    Marital Status: Divorced  Catering manager Violence: Not on file    Tobacco Use: Medium Risk (07/19/2024)   Patient History    Smoking Tobacco Use: Former    Smokeless Tobacco Use: Never    Passive Exposure: Not on file   Social History   Substance and Sexual Activity  Alcohol Use Yes   Alcohol/week: 4.0 standard drinks of alcohol   Types: 4 Standard drinks or equivalent per week   Comment: 4-6 beers weekly    Family History  Problem Relation Age of Onset   Depression Mother    Neuropathy Mother    Vision loss Mother    COPD Father    Osteoarthritis Father    Neuropathy Father    Arthritis Father    Heart disease Father    Hyperlipidemia Father    Melanoma Sister    Anxiety disorder Sister    Vision loss Maternal Grandmother    Colon cancer Paternal Grandmother    Miscarriages / Stillbirths Daughter     ROS  Objective:  Physical Exam: - General: Well-developed female, alert, oriented, and in no apparent distress.  - Left Knee: No effusion. Range of motion 0-130 degrees. Significant crepitus on range of motion. Knee shifts during flexion to extension. Instability noted, with valgus deformity ranging from 10 degrees to neutral in extension. No anterior-posterior laxity. Antalgic gait pattern observed on the left.  - Left Hip: Normal range of motion without discomfort.  IMAGING:  - Radiographs of the left knee from May, including AP and lateral views, demonstrate bone-on-bone contact in the lateral compartment on the lateral view. The AP view shows slight valgus with lateral narrowing, but bone-on-bone contact is confirmed on the lateral view. Valgus deformity is more pronounced while standing compared to seated radiographs.  Assessment/Plan:  End stage arthritis,  left knee   The patient history, physical examination, clinical judgment of the provider and imaging studies are consistent with end stage degenerative joint disease of the left knee and total knee arthroplasty is deemed medically necessary. The treatment options including medical management, injection therapy arthroscopy and arthroplasty were discussed at length. The risks and benefits of total knee arthroplasty were presented and reviewed. The risks due to aseptic loosening, infection,  stiffness, patella tracking problems, thromboembolic complications and other imponderables were discussed. The patient acknowledged the explanation, agreed to proceed with the plan and consent was signed. Patient is being admitted for inpatient treatment for surgery, pain control, PT, OT, prophylactic antibiotics, VTE prophylaxis, progressive ambulation and ADLs and discharge planning. The patient is planning to be discharged home.   Patient's anticipated LOS is less than 2 midnights, meeting these requirements: - Younger than 8 - Lives within 1 hour of care - Has a competent adult at home to recover with post-op recover - NO history of  - Diabetes  - Coronary Artery Disease  - Heart failure  - Heart attack  - Stroke  - DVT/VTE  - Cardiac arrhythmia  - Respiratory Failure/COPD  - Renal failure  - Anemia  - Advanced Liver disease  Therapy Plans: [facility] in [place] Disposition: Home with boyfriend and son Planned DVT Prophylaxis: Aspirin  81mg  BID  DME Needed: None PCP: Ginger Patrick, FNP (clearance received) Pulmonologist: Dedra Sanders, MD (clearance received) TXA: IV Allergies: pollen Anesthesia Concerns: Nausea with GA BMI: 35 Last HgbA1c: 5.4% 07/2024 Pharmacy: [facility]   Other: -SDD -Currently taking 5mg  hydrocodone BID and 10mg  once daily. Discussed holding this and taking oxycodone  post-operatively  - Patient was instructed on what medications to stop prior to surgery. -  Follow-up visit in 2 weeks with Dr. Melodi - Begin physical therapy following surgery - Pre-operative lab work as pre-surgical testing - Prescriptions will be provided in hospital at time of discharge   Corean Sender, PA-C Orthopedic Surgery [facility] [place]

## 2024-08-03 MED ORDER — SERTRALINE HCL 100 MG PO TABS: 100.0000 mg | ORAL_TABLET | Freq: Every day | ORAL | 3 refills | Status: DC

## 2024-08-03 NOTE — Addendum Note (Signed)
 Addended by: CORWIN ANTU on: 08/03/2024 10:22 PM   Modules accepted: Orders

## 2024-08-11 ENCOUNTER — Ambulatory Visit

## 2024-08-11 DIAGNOSIS — Z23 Encounter for immunization: Secondary | ICD-10-CM | POA: Diagnosis not present

## 2024-08-11 NOTE — Patient Instructions (Addendum)
 SURGICAL WAITING ROOM VISITATION  Patients having surgery or a procedure may have no more than 2 support people in the waiting area - these visitors may rotate.    Children under the age of 8 must have an adult with them who is not the patient.  Visitors with respiratory illnesses are discouraged from visiting and should remain at home.  If the patient needs to stay at the hospital during part of their recovery, the visitor guidelines for inpatient rooms apply. Pre-op nurse will coordinate an appropriate time for 1 support person to accompany patient in pre-op.  This support person may not rotate.    Please refer to the [facility] website for the visitor guidelines for Inpatients (after your surgery is over and you are in a regular room).       Your procedure is scheduled on: 08/21/24   Report to [facility] Main Entrance    Report to admitting at  5:15 AM   Call this number if you have problems the morning of surgery [phone]   Do not eat food :After Midnight.   After Midnight you may have the following liquids until 4:15 AM DAY OF SURGERY  Water Non-Citrus Juices (without pulp, NO RED-Apple, White grape, White cranberry) Black Coffee (NO MILK/CREAM OR CREAMERS, sugar ok)  Clear Tea (NO MILK/CREAM OR CREAMERS, sugar ok) regular and decaf                             Plain Jell-O (NO RED)                                           Fruit ices (not with fruit pulp, NO RED)                                     Popsicles (NO RED)                                                               Sports drinks like Gatorade (NO RED)                The day of surgery:  Drink ONE (1) Pre-Surgery  G2 at 5:15 AM the morning of surgery. Drink in one sitting. Do not sip.  This drink was given to you during your hospital  pre-op appointment visit. Nothing else to drink after completing the  Pre-Surgery  G2.           Oral Hygiene is also important to reduce your risk of  infection.                                    Remember - BRUSH YOUR TEETH THE MORNING OF SURGERY WITH YOUR REGULAR TOOTHPASTE   Stop all vitamins and herbal supplements 7 days before surgery.   Take these medicines the morning of surgery with A SIP OF WATER: Xanax , Atorvastatin , Wellbutrin, Gabapentin, Hydrocodone, Omeprazole(Prilosec)  You may not have any metal on your body including hair pins, jewelry, and body piercing             Do not wear make-up, lotions, powders, perfumes/cologne, or deodorant  Do not wear nail polish including gel and S&S, artificial/acrylic nails, or any other type of covering on natural nails including finger and toenails. If you have artificial nails, gel coating, etc. that needs to be removed by a nail salon please have this removed prior to surgery or surgery may need to be canceled/ delayed if the surgeon/ anesthesia feels like they are unable to be safely monitored.   Do not shave  48 hours prior to surgery.    Do not bring valuables to the hospital. [facility] IS NOT             RESPONSIBLE   FOR VALUABLES.   Contacts, glasses, dentures or bridgework may not be worn into surgery.  DO NOT BRING YOUR HOME MEDICATIONS TO THE HOSPITAL. PHARMACY WILL DISPENSE MEDICATIONS LISTED ON YOUR MEDICATION LIST TO YOU DURING YOUR ADMISSION IN THE HOSPITAL!    Patients discharged on the day of surgery will not be allowed to drive home.  Someone NEEDS to stay with you for the first 24 hours after anesthesia.   Special Instructions: Bring a copy of your healthcare power of attorney and living will documents the day of surgery if you haven't scanned them before.              Please read over the following fact sheets you were given: IF YOU HAVE QUESTIONS ABOUT YOUR PRE-OP INSTRUCTIONS PLEASE CALL [phone] [place]   If you received a COVID test during your pre-op visit  it is requested that you wear a mask when out in public, stay away from anyone that may not  be feeling well and notify your surgeon if you develop symptoms. If you test positive for Covid or have been in contact with anyone that has tested positive in the last 10 days please notify you surgeon.      Pre-operative 5 CHG Bath Instructions   You can play a key role in reducing the risk of infection after surgery. Your skin needs to be as free of germs as possible. You can reduce the number of germs on your skin by washing with CHG (chlorhexidine  gluconate) soap before surgery. CHG is an antiseptic soap that kills germs and continues to kill germs even after washing.   DO NOT use if you have an allergy to chlorhexidine /CHG or antibacterial soaps. If your skin becomes reddened or irritated, stop using the CHG and notify one of our RNs at [phone].   Please shower with the CHG soap starting 4 days before surgery using the following schedule:     Please keep in mind the following:  DO NOT shave, including legs and underarms, starting the day of your first shower.   You may shave your face at any point before/day of surgery.  Place clean sheets on your bed the day you start using CHG soap. Use a clean washcloth (not used since being washed) for each shower. DO NOT sleep with pets once you start using the CHG.   CHG Shower Instructions:  If you choose to wash your hair and private area, wash first with your normal shampoo/soap.  After you use shampoo/soap, rinse your hair and body thoroughly to remove shampoo/soap residue.  Turn the water OFF and apply about 3 tablespoons (45 ml) of  CHG soap to a CLEAN washcloth.  Apply CHG soap ONLY FROM YOUR NECK DOWN TO YOUR TOES (washing for 3-5 minutes)  DO NOT use CHG soap on face, private areas, open wounds, or sores.  Pay special attention to the area where your surgery is being performed.  If you are having back surgery, having someone wash your back for you may be helpful. Wait 2 minutes after CHG soap is applied, then you may rinse off  the CHG soap.  Pat dry with a clean towel  Put on clean clothes/pajamas   If you choose to wear lotion, please use ONLY the CHG-compatible lotions on the back of this paper.     Additional instructions for the day of surgery: DO NOT APPLY any lotions, deodorants, cologne, or perfumes.   Put on clean/comfortable clothes.  Brush your teeth.  Ask your nurse before applying any prescription medications to the skin.      CHG Compatible Lotions   Aveeno Moisturizing lotion  Cetaphil Moisturizing Cream  Cetaphil Moisturizing Lotion  Clairol Herbal Essence Moisturizing Lotion, Dry Skin  Clairol Herbal Essence Moisturizing Lotion, Extra Dry Skin  Clairol Herbal Essence Moisturizing Lotion, Normal Skin  Curel Age Defying Therapeutic Moisturizing Lotion with Alpha Hydroxy  Curel Extreme Care Body Lotion  Curel Soothing Hands Moisturizing Hand Lotion  Curel Therapeutic Moisturizing Cream, Fragrance-Free  Curel Therapeutic Moisturizing Lotion, Fragrance-Free  Curel Therapeutic Moisturizing Lotion, Original Formula  Eucerin Daily Replenishing Lotion  Eucerin Dry Skin Therapy Plus Alpha Hydroxy Crme  Eucerin Dry Skin Therapy Plus Alpha Hydroxy Lotion  Eucerin Original Crme  Eucerin Original Lotion  Eucerin Plus Crme Eucerin Plus Lotion  Eucerin TriLipid Replenishing Lotion  Keri Anti-Bacterial Hand Lotion  Keri Deep Conditioning Original Lotion Dry Skin Formula Softly Scented  Keri Deep Conditioning Original Lotion, Fragrance Free Sensitive Skin Formula  Keri Lotion Fast Absorbing Fragrance Free Sensitive Skin Formula  Keri Lotion Fast Absorbing Softly Scented Dry Skin Formula  Keri Original Lotion  Keri Skin Renewal Lotion Keri Silky Smooth Lotion  Keri Silky Smooth Sensitive Skin Lotion  Nivea Body Creamy Conditioning Oil  Nivea Body Extra Enriched Lotion  Nivea Body Original Lotion  Nivea Body Sheer Moisturizing Lotion Nivea Crme  Nivea Skin Firming Lotion  NutraDerm 30 Skin  Lotion  NutraDerm Skin Lotion  NutraDerm Therapeutic Skin Cream  NutraDerm Therapeutic Skin Lotion  ProShield Protective Hand Cream   Incentive Spirometer (Watch this video at home: [email or web address])  An incentive spirometer is a tool that can help keep your lungs clear and active. This tool measures how well you are filling your lungs with each breath. Taking long deep breaths may help reverse or decrease the chance of developing breathing (pulmonary) problems (especially infection) following: A long period of time when you are unable to move or be active. BEFORE THE PROCEDURE  If the spirometer includes an indicator to show your best effort, your nurse or respiratory therapist will set it to a desired goal. If possible, sit up straight or lean slightly forward. Try not to slouch. Hold the incentive spirometer in an upright position. INSTRUCTIONS FOR USE  Sit on the edge of your bed if possible, or sit up as far as you can in bed or on a chair. Hold the incentive spirometer in an upright position. Breathe out normally. Place the mouthpiece in your mouth and seal your lips tightly around it. Breathe in slowly and as deeply as possible, raising the piston or the ball toward the top of the  column. Hold your breath for 3-5 seconds or for as long as possible. Allow the piston or ball to fall to the bottom of the column. Remove the mouthpiece from your mouth and breathe out normally. Rest for a few seconds and repeat Steps 1 through 7 at least 10 times every 1-2 hours when you are awake. Take your time and take a few normal breaths between deep breaths. The spirometer may include an indicator to show your best effort. Use the indicator as a goal to work toward during each repetition. After each set of 10 deep breaths, practice coughing to be sure your lungs are clear. If you have an incision (the cut made at the time of surgery), support your incision when coughing by  placing a pillow or rolled up towels firmly against it. Once you are able to get out of bed, walk around indoors and cough well. You may stop using the incentive spirometer when instructed by your caregiver.  RISKS AND COMPLICATIONS Take your time so you do not get dizzy or light-headed. If you are in pain, you may need to take or ask for pain medication before doing incentive spirometry. It is harder to take a deep breath if you are having pain. AFTER USE Rest and breathe slowly and easily. It can be helpful to keep track of a log of your progress. Your caregiver can provide you with a simple table to help with this. If you are using the spirometer at home, follow these instructions: SEEK MEDICAL CARE IF:  You are having difficultly using the spirometer. You have trouble using the spirometer as often as instructed. Your pain medication is not giving enough relief while using the spirometer. You develop fever of 100.5 F (38.1 C) or higher. SEEK IMMEDIATE MEDICAL CARE IF:  You cough up bloody sputum that had not been present before. You develop fever of 102 F (38.9 C) or greater. You develop worsening pain at or near the incision site. MAKE SURE YOU:  Understand these instructions. Will watch your condition. Will get help right away if you are not doing well or get worse. Document Released: 04/05/2007 Document Revised: 02/15/2012 Document Reviewed: 06/06/2007 [facility] Patient Information 2014 [place], [place].

## 2024-08-11 NOTE — Progress Notes (Signed)
 Pt here for flu vaccine.

## 2024-08-11 NOTE — Progress Notes (Addendum)
 COVID Vaccine received:  []  No [x]  Yes Date of any COVID positive Test in last 90 days: no PCP - Ginger Patrick FNP Cardiologist - Dr. Ethel Patron  Chest x-ray - CT chest 08/23/23 Epic EKG -  05/15/24 Epic Stress Test -  ECHO -  Cardiac Cath -   Bowel Prep - [x]  No  []   Yes ______  Pacemaker / ICD device [x]  No []  Yes   Spinal Cord Stimulator:[x]  No []  Yes       History of Sleep Apnea? [x]  No []  Yes   CPAP used?- [x]  No []  Yes    Does the patient monitor blood sugar?          [x]  No []  Yes  []  N/A  Patient has: [x]  NO Hx DM   []  Pre-DM                 []  DM1  []   DM2 Does patient have a [employer] or Dexacom? []  No []  Yes   Fasting Blood Sugar Ranges-  Checks Blood Sugar _____ times a day  GLP1 agonist / usual dose - no GLP1 instructions:  SGLT-2 inhibitors / usual dose - no SGLT-2 instructions:   Blood Thinner / Instructions:no Aspirin  Instructions:no  Comments:   Activity level: Patient is able to climb a flight of stairs without difficulty; [x]  No CP  [x]  No SOB,   Patient can perform ADLs without assistance.   Anesthesia review:   Patient denies shortness of breath, fever, cough and chest pain at PAT appointment.  Patient verbalized understanding and agreement to the Pre-Surgical Instructions that were given to them at this PAT appointment. Patient was also educated of the need to review these PAT instructions again prior to his/her surgery.I reviewed the appropriate phone numbers to call if they have any and questions or concerns.

## 2024-08-14 ENCOUNTER — Other Ambulatory Visit: Payer: Self-pay

## 2024-08-14 ENCOUNTER — Encounter (HOSPITAL_COMMUNITY)
Admission: RE | Admit: 2024-08-14 | Discharge: 2024-08-14 | Disposition: A | Discharge status: Home / Self Care | Source: Ambulatory Visit | Attending: Orthopedic Surgery

## 2024-08-14 ENCOUNTER — Encounter (HOSPITAL_COMMUNITY): Payer: Self-pay

## 2024-08-14 VITALS — BP 166/95 | HR 63 | Temp 98.2°F | Resp 16 | Ht 66.0 in | Wt 215.0 lb

## 2024-08-14 DIAGNOSIS — Z01812 Encounter for preprocedural laboratory examination: Secondary | ICD-10-CM | POA: Insufficient documentation

## 2024-08-14 DIAGNOSIS — Z01818 Encounter for other preprocedural examination: Secondary | ICD-10-CM

## 2024-08-14 LAB — SURGICAL PCR SCREEN
MRSA, PCR: NEGATIVE
Staphylococcus aureus: NEGATIVE

## 2024-08-20 NOTE — Anesthesia Preprocedure Evaluation (Addendum)
 Anesthesia Evaluation  Patient identified by MRN, date of birth, ID band Patient awake    Reviewed: Allergy & Precautions, NPO status , Patient's Chart, lab work & pertinent test results  Airway Mallampati: II  TM Distance: >3 FB Neck ROM: Full    Dental  (+) Dental Advisory Given, Teeth Intact,    Pulmonary COPD, former smoker   Pulmonary exam normal breath sounds clear to auscultation       Cardiovascular Exercise Tolerance: Good + CAD  Normal cardiovascular exam Rhythm:Regular Rate:Normal     Neuro/Psych  PSYCHIATRIC DISORDERS Anxiety Depression     Neuromuscular disease    GI/Hepatic Neg liver ROS,GERD  Medicated,,  Endo/Other  Obesity   Renal/GU negative Renal ROS     Musculoskeletal  (+) Arthritis ,  Right knee osteoarthritis   Abdominal  (+) + obese  Peds  Hematology negative hematology ROS (+) 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  Reproductive/Obstetrics                              Anesthesia Physical Anesthesia Plan  ASA: 2  Anesthesia Plan: Spinal   Post-op Pain Management: Regional block*, Celebrex  PO (pre-op)* and Ofirmev  IV (intra-op)*   Induction: Intravenous  PONV Risk Score and Plan: 3 and Midazolam , Dexamethasone , TIVA, Ondansetron , Treatment may vary due to age or medical condition and Propofol  infusion  Airway Management Planned: Natural Airway and Simple Face Mask  Additional Equipment: None  Intra-op Plan:   Post-operative Plan:   Informed Consent: I have reviewed the patients History and Physical, chart, labs and discussed the procedure including the risks, benefits and alternatives for the proposed anesthesia with the patient or authorized representative who has indicated his/her understanding and acceptance.     Dental advisory given  Plan Discussed with: CRNA  Anesthesia Plan Comments:  (Risks of anesthesia explained at length. This includes, but is not limited to, sore throat, damage to teeth, lips gums, tongue and vocal cords, nausea and vomiting, reactions to medications, stroke, heart attack, and death. All patient questions were answered and the patient wishes to proceed. Risks of peripheral nerve block explained at length. This includes, but is not limited to, bleeding, infection, reactions to the medications, seizures, damage to surrounding structures, damage to nerves, permanent weakness, numbness, tingling and pain. All patient questions were answered and patient wishes to proceed with nerve block. Risks of spinal anesthesia explained at length. This includes, but is not limited to, bleeding, infection, reactions to the medications, seizures, headache, damage to surrounding structures, damage to nerves, permanent weakness, numbness, tingling and pain. All patient questions were answered and patient wishes to proceed with spinal anesthesia.  )         Anesthesia Quick Evaluation

## 2024-08-21 ENCOUNTER — Encounter (HOSPITAL_COMMUNITY): Payer: Self-pay | Admitting: Orthopedic Surgery

## 2024-08-21 ENCOUNTER — Ambulatory Visit (HOSPITAL_COMMUNITY)
Admission: RE | Admit: 2024-08-21 | Discharge: 2024-08-21 | Disposition: A | Discharge status: Home / Self Care | Payer: Self-pay | Attending: Orthopedic Surgery | Admitting: Orthopedic Surgery

## 2024-08-21 ENCOUNTER — Ambulatory Visit (HOSPITAL_COMMUNITY): Payer: Self-pay | Admitting: Medical

## 2024-08-21 ENCOUNTER — Encounter (HOSPITAL_COMMUNITY)
Admission: RE | Disposition: A | Discharge status: Home / Self Care | Payer: Self-pay | Source: Home / Self Care | Attending: Orthopedic Surgery

## 2024-08-21 ENCOUNTER — Ambulatory Visit (HOSPITAL_BASED_OUTPATIENT_CLINIC_OR_DEPARTMENT_OTHER): Payer: Self-pay | Admitting: Anesthesiology

## 2024-08-21 DIAGNOSIS — F32A Depression, unspecified: Secondary | ICD-10-CM | POA: Insufficient documentation

## 2024-08-21 DIAGNOSIS — Z79899 Other long term (current) drug therapy: Secondary | ICD-10-CM | POA: Insufficient documentation

## 2024-08-21 DIAGNOSIS — J449 Chronic obstructive pulmonary disease, unspecified: Secondary | ICD-10-CM | POA: Diagnosis not present

## 2024-08-21 DIAGNOSIS — I2510 Atherosclerotic heart disease of native coronary artery without angina pectoris: Secondary | ICD-10-CM | POA: Diagnosis not present

## 2024-08-21 DIAGNOSIS — M1712 Unilateral primary osteoarthritis, left knee: Secondary | ICD-10-CM

## 2024-08-21 DIAGNOSIS — K219 Gastro-esophageal reflux disease without esophagitis: Secondary | ICD-10-CM | POA: Insufficient documentation

## 2024-08-21 DIAGNOSIS — Z87891 Personal history of nicotine dependence: Secondary | ICD-10-CM | POA: Insufficient documentation

## 2024-08-21 DIAGNOSIS — F419 Anxiety disorder, unspecified: Secondary | ICD-10-CM | POA: Insufficient documentation

## 2024-08-21 DIAGNOSIS — M170 Bilateral primary osteoarthritis of knee: Secondary | ICD-10-CM | POA: Diagnosis present

## 2024-08-21 HISTORY — PX: TOTAL KNEE ARTHROPLASTY: SHX125

## 2024-08-21 SURGERY — ARTHROPLASTY, KNEE, TOTAL
Anesthesia: Spinal | Site: Knee | Laterality: Left

## 2024-08-21 MED ORDER — BUPIVACAINE LIPOSOME 1.3 % IJ SUSP: 20.0000 mL | Freq: Once | INTRAMUSCULAR | Status: DC

## 2024-08-21 MED ORDER — LACTATED RINGERS IV BOLUS
250.0000 mL | Freq: Once | INTRAVENOUS | Status: AC
  Administered 2024-08-21: 250 mL via INTRAVENOUS

## 2024-08-21 MED ORDER — CELECOXIB 200 MG PO CAPS
200.0000 mg | ORAL_CAPSULE | Freq: Once | ORAL | Status: DC
  Filled 2024-08-21: qty 1

## 2024-08-21 MED ORDER — DEXAMETHASONE SODIUM PHOSPHATE 4 MG/ML IJ SOLN
INTRAMUSCULAR | Status: DC | PRN
  Administered 2024-08-21: 4 mg via PERINEURAL

## 2024-08-21 MED ORDER — OXYCODONE HCL 5 MG PO TABS: 10.0000 mg | ORAL_TABLET | ORAL | Status: DC | PRN

## 2024-08-21 MED ORDER — FENTANYL CITRATE (PF) 100 MCG/2ML IJ SOLN
INTRAMUSCULAR | Status: AC
  Filled 2024-08-21: qty 2

## 2024-08-21 MED ORDER — MIDAZOLAM HCL 2 MG/2ML IJ SOLN
INTRAMUSCULAR | Status: DC | PRN
  Administered 2024-08-21 (×2): 1 mg via INTRAVENOUS

## 2024-08-21 MED ORDER — ORAL CARE MOUTH RINSE: 15.0000 mL | Freq: Once | OROMUCOSAL | Status: AC

## 2024-08-21 MED ORDER — METHOCARBAMOL 500 MG PO TABS
ORAL_TABLET | ORAL | Status: AC
  Filled 2024-08-21: qty 1

## 2024-08-21 MED ORDER — OXYCODONE HCL 5 MG PO TABS
5.0000 mg | ORAL_TABLET | ORAL | Status: DC | PRN
  Administered 2024-08-21: 5 mg via ORAL

## 2024-08-21 MED ORDER — METOCLOPRAMIDE HCL 5 MG/ML IJ SOLN: 5.0000 mg | Freq: Three times a day (TID) | INTRAMUSCULAR | Status: DC | PRN

## 2024-08-21 MED ORDER — DEXAMETHASONE SODIUM PHOSPHATE 10 MG/ML IJ SOLN: 8.0000 mg | Freq: Once | INTRAMUSCULAR | Status: DC

## 2024-08-21 MED ORDER — CHLORHEXIDINE GLUCONATE 0.12 % MT SOLN
15.0000 mL | Freq: Once | OROMUCOSAL | Status: AC
  Administered 2024-08-21: 15 mL via OROMUCOSAL

## 2024-08-21 MED ORDER — HYDROMORPHONE HCL 1 MG/ML IJ SOLN: 0.5000 mg | INTRAMUSCULAR | Status: DC | PRN

## 2024-08-21 MED ORDER — ONDANSETRON HCL 4 MG/2ML IJ SOLN
INTRAMUSCULAR | Status: AC
  Filled 2024-08-21: qty 2

## 2024-08-21 MED ORDER — PHENYLEPHRINE HCL-NACL 20-0.9 MG/250ML-% IV SOLN
INTRAVENOUS | Status: DC | PRN
Start: 2024-08-21 — End: 2024-08-21
  Administered 2024-08-21: 50 ug/min via INTRAVENOUS

## 2024-08-21 MED ORDER — LACTATED RINGERS IV SOLN: INTRAVENOUS | Status: DC

## 2024-08-21 MED ORDER — CLONIDINE HCL (ANALGESIA) 100 MCG/ML EP SOLN
EPIDURAL | Status: DC | PRN
  Administered 2024-08-21: 60 ug

## 2024-08-21 MED ORDER — METOCLOPRAMIDE HCL 5 MG PO TABS: 5.0000 mg | ORAL_TABLET | Freq: Three times a day (TID) | ORAL | Status: DC | PRN

## 2024-08-21 MED ORDER — ROPIVACAINE HCL 5 MG/ML IJ SOLN
INTRAMUSCULAR | Status: DC | PRN
  Administered 2024-08-21: 20 mL via PERINEURAL

## 2024-08-21 MED ORDER — CEFAZOLIN SODIUM-DEXTROSE 2-4 GM/100ML-% IV SOLN
2.0000 g | INTRAVENOUS | Status: AC
  Administered 2024-08-21: 2 g via INTRAVENOUS
  Filled 2024-08-21: qty 100

## 2024-08-21 MED ORDER — POVIDONE-IODINE 10 % EX SWAB
2.0000 | Freq: Once | CUTANEOUS | Status: AC
  Administered 2024-08-21: 2 via TOPICAL

## 2024-08-21 MED ORDER — PROPOFOL 500 MG/50ML IV EMUL
INTRAVENOUS | Status: AC
  Filled 2024-08-21: qty 50

## 2024-08-21 MED ORDER — ACETAMINOPHEN 10 MG/ML IV SOLN
INTRAVENOUS | Status: DC | PRN
  Administered 2024-08-21: 1000 mg via INTRAVENOUS

## 2024-08-21 MED ORDER — TRANEXAMIC ACID-NACL 1000-0.7 MG/100ML-% IV SOLN
1000.0000 mg | INTRAVENOUS | Status: DC
  Filled 2024-08-21: qty 100

## 2024-08-21 MED ORDER — METHOCARBAMOL 500 MG PO TABS
500.0000 mg | ORAL_TABLET | Freq: Four times a day (QID) | ORAL | Status: DC | PRN
  Administered 2024-08-21: 500 mg via ORAL

## 2024-08-21 MED ORDER — BUPIVACAINE LIPOSOME 1.3 % IJ SUSP
INTRAMUSCULAR | Status: AC
Start: 2024-08-21 — End: 2024-08-21
  Filled 2024-08-21: qty 20

## 2024-08-21 MED ORDER — ONDANSETRON HCL 4 MG/2ML IJ SOLN
INTRAMUSCULAR | Status: DC | PRN
  Administered 2024-08-21: 4 mg via INTRAVENOUS

## 2024-08-21 MED ORDER — STERILE WATER FOR IRRIGATION IR SOLN
Status: DC | PRN
  Administered 2024-08-21: 2000 mL

## 2024-08-21 MED ORDER — PROPOFOL 10 MG/ML IV BOLUS
INTRAVENOUS | Status: DC | PRN
  Administered 2024-08-21 (×2): 50 mg via INTRAVENOUS
  Administered 2024-08-21: 120 ug/kg/min via INTRAVENOUS

## 2024-08-21 MED ORDER — TRANEXAMIC ACID-NACL 1000-0.7 MG/100ML-% IV SOLN
INTRAVENOUS | Status: DC | PRN
  Administered 2024-08-21: 1000 mg via INTRAVENOUS

## 2024-08-21 MED ORDER — ONDANSETRON HCL 4 MG PO TABS: 4.0000 mg | ORAL_TABLET | Freq: Four times a day (QID) | ORAL | Status: DC | PRN

## 2024-08-21 MED ORDER — 0.9 % SODIUM CHLORIDE (POUR BTL) OPTIME
TOPICAL | Status: DC | PRN
  Administered 2024-08-21: 1000 mL

## 2024-08-21 MED ORDER — OXYCODONE HCL 5 MG PO TABS: 5.0000 mg | ORAL_TABLET | Freq: Four times a day (QID) | ORAL | 0 refills | Status: AC | PRN

## 2024-08-21 MED ORDER — BUPIVACAINE IN DEXTROSE 0.75-8.25 % IT SOLN
INTRATHECAL | Status: DC | PRN
  Administered 2024-08-21: 1.4 mL via INTRATHECAL

## 2024-08-21 MED ORDER — DEXAMETHASONE SODIUM PHOSPHATE 10 MG/ML IJ SOLN
INTRAMUSCULAR | Status: AC
  Filled 2024-08-21: qty 1

## 2024-08-21 MED ORDER — SODIUM CHLORIDE (PF) 0.9 % IJ SOLN
INTRAMUSCULAR | Status: AC
Start: 2024-08-21 — End: 2024-08-21
  Filled 2024-08-21: qty 50

## 2024-08-21 MED ORDER — LACTATED RINGERS IV SOLN
INTRAVENOUS | Status: DC | PRN
Start: 2024-08-21 — End: 2024-08-21

## 2024-08-21 MED ORDER — OXYCODONE HCL 5 MG PO TABS
ORAL_TABLET | ORAL | Status: AC
  Filled 2024-08-21: qty 1

## 2024-08-21 MED ORDER — LACTATED RINGERS IV BOLUS
500.0000 mL | Freq: Once | INTRAVENOUS | Status: AC
  Administered 2024-08-21: 500 mL via INTRAVENOUS

## 2024-08-21 MED ORDER — SODIUM CHLORIDE (PF) 0.9 % IJ SOLN
INTRAMUSCULAR | Status: AC
Start: 2024-08-21 — End: 2024-08-21
  Filled 2024-08-21: qty 10

## 2024-08-21 MED ORDER — METHOCARBAMOL 500 MG PO TABS: 500.0000 mg | ORAL_TABLET | Freq: Four times a day (QID) | ORAL | 0 refills | Status: AC | PRN

## 2024-08-21 MED ORDER — PHENYLEPHRINE HCL (PRESSORS) 10 MG/ML IV SOLN
INTRAVENOUS | Status: AC
  Filled 2024-08-21: qty 1

## 2024-08-21 MED ORDER — SODIUM CHLORIDE (PF) 0.9 % IJ SOLN
INTRAMUSCULAR | Status: DC | PRN
  Administered 2024-08-21: 60 mL

## 2024-08-21 MED ORDER — PROPOFOL 1000 MG/100ML IV EMUL
INTRAVENOUS | Status: AC
  Filled 2024-08-21: qty 100

## 2024-08-21 MED ORDER — MIDAZOLAM HCL 2 MG/2ML IJ SOLN
INTRAMUSCULAR | Status: AC
  Filled 2024-08-21: qty 2

## 2024-08-21 MED ORDER — CEFAZOLIN SODIUM-DEXTROSE 2-4 GM/100ML-% IV SOLN: 2.0000 g | Freq: Four times a day (QID) | INTRAVENOUS | Status: DC

## 2024-08-21 MED ORDER — SODIUM CHLORIDE 0.9 % IR SOLN
Status: DC | PRN
  Administered 2024-08-21: 1000 mL

## 2024-08-21 MED ORDER — ACETAMINOPHEN 500 MG PO TABS: 1000.0000 mg | ORAL_TABLET | Freq: Four times a day (QID) | ORAL | Status: DC

## 2024-08-21 MED ORDER — LIDOCAINE HCL (PF) 2 % IJ SOLN
INTRAMUSCULAR | Status: DC | PRN
Start: 2024-08-21 — End: 2024-08-21
  Administered 2024-08-21: 50 mg via INTRADERMAL

## 2024-08-21 MED ORDER — DROPERIDOL 2.5 MG/ML IJ SOLN: 0.6250 mg | Freq: Once | INTRAMUSCULAR | Status: DC | PRN

## 2024-08-21 MED ORDER — PHENYLEPHRINE 80 MCG/ML (10ML) SYRINGE FOR IV PUSH (FOR BLOOD PRESSURE SUPPORT)
PREFILLED_SYRINGE | INTRAVENOUS | Status: AC
  Filled 2024-08-21: qty 10

## 2024-08-21 MED ORDER — FENTANYL CITRATE (PF) 100 MCG/2ML IJ SOLN
INTRAMUSCULAR | Status: DC | PRN
  Administered 2024-08-21 (×2): 50 ug via INTRAVENOUS

## 2024-08-21 MED ORDER — BUPIVACAINE LIPOSOME 1.3 % IJ SUSP
INTRAMUSCULAR | Status: DC | PRN
  Administered 2024-08-21: 20 mL

## 2024-08-21 MED ORDER — ONDANSETRON HCL 4 MG/2ML IJ SOLN: 4.0000 mg | Freq: Four times a day (QID) | INTRAMUSCULAR | Status: DC | PRN

## 2024-08-21 MED ORDER — DEXAMETHASONE SODIUM PHOSPHATE 10 MG/ML IJ SOLN
INTRAMUSCULAR | Status: DC | PRN
  Administered 2024-08-21: 10 mg via INTRAVENOUS

## 2024-08-21 MED ORDER — ACETAMINOPHEN 10 MG/ML IV SOLN
1000.0000 mg | Freq: Four times a day (QID) | INTRAVENOUS | Status: DC
  Filled 2024-08-21: qty 100

## 2024-08-21 MED ORDER — HYDROMORPHONE HCL 1 MG/ML IJ SOLN: 0.2500 mg | INTRAMUSCULAR | Status: DC | PRN

## 2024-08-21 MED ORDER — PROPOFOL 10 MG/ML IV BOLUS
INTRAVENOUS | Status: AC
  Filled 2024-08-21: qty 20

## 2024-08-21 MED ORDER — METHOCARBAMOL 1000 MG/10ML IJ SOLN: 500.0000 mg | Freq: Four times a day (QID) | INTRAMUSCULAR | Status: DC | PRN

## 2024-08-21 MED ORDER — ASPIRIN 81 MG PO TBEC: 81.0000 mg | DELAYED_RELEASE_TABLET | Freq: Two times a day (BID) | ORAL | 0 refills | Status: AC

## 2024-08-21 SURGICAL SUPPLY — 44 items
ATTUNE PSFEM LTSZ6 NARCEM KNEE (Femur) IMPLANT
ATTUNE PSRP INSR SZ6 10 KNEE (Insert) IMPLANT
BAG COUNTER SPONGE SURGICOUNT (BAG) IMPLANT
BAG ZIPLOCK 12X15 (MISCELLANEOUS) ×1 IMPLANT
BASE TIBIAL ROT PLAT SZ 5 KNEE (Knees) IMPLANT
BLADE SAG 18X100X1.27 (BLADE) ×1 IMPLANT
BLADE SAW SGTL 11.0X1.19X90.0M (BLADE) ×1 IMPLANT
BNDG ELASTIC 6INX 5YD STR LF (GAUZE/BANDAGES/DRESSINGS) ×1 IMPLANT
BOWL SMART MIX CTS (DISPOSABLE) ×1 IMPLANT
CEMENT HV SMART SET (Cement) ×2 IMPLANT
COVER SURGICAL LIGHT HANDLE (MISCELLANEOUS) ×1 IMPLANT
CUFF TRNQT CYL 34X4.125X (TOURNIQUET CUFF) ×1 IMPLANT
DERMABOND ADVANCED .7 DNX12 (GAUZE/BANDAGES/DRESSINGS) ×1 IMPLANT
DRAPE U-SHAPE 47X51 STRL (DRAPES) ×1 IMPLANT
DRSG AQUACEL AG ADV 3.5X10 (GAUZE/BANDAGES/DRESSINGS) ×1 IMPLANT
DURAPREP 26ML APPLICATOR (WOUND CARE) ×1 IMPLANT
ELECT REM PT RETURN 15FT ADLT (MISCELLANEOUS) ×1 IMPLANT
GLOVE BIO SURGEON STRL SZ 6.5 (GLOVE) IMPLANT
GLOVE BIO SURGEON STRL SZ7 (GLOVE) IMPLANT
GLOVE BIO SURGEON STRL SZ8 (GLOVE) ×1 IMPLANT
GLOVE BIOGEL PI IND STRL 7.0 (GLOVE) ×1 IMPLANT
GLOVE BIOGEL PI IND STRL 8 (GLOVE) ×1 IMPLANT
GOWN STRL REUS W/ TWL LRG LVL3 (GOWN DISPOSABLE) ×1 IMPLANT
HOLDER FOLEY CATH W/STRAP (MISCELLANEOUS) ×1 IMPLANT
IMMOBILIZER KNEE 20 THIGH 36 (SOFTGOODS) ×1 IMPLANT
KIT TURNOVER KIT A (KITS) ×1 IMPLANT
MANIFOLD NEPTUNE II (INSTRUMENTS) ×1 IMPLANT
NS IRRIG 1000ML POUR BTL (IV SOLUTION) ×1 IMPLANT
PACK TOTAL KNEE CUSTOM (KITS) ×1 IMPLANT
PADDING CAST COTTON 6X4 STRL (CAST SUPPLIES) ×2 IMPLANT
PATELLA MEDIAL ATTUN 35MM KNEE (Knees) IMPLANT
PENCIL SMOKE EVACUATOR (MISCELLANEOUS) ×1 IMPLANT
PIN STEINMAN FIXATION KNEE (PIN) IMPLANT
PROTECTOR NERVE ULNAR (MISCELLANEOUS) ×1 IMPLANT
SET HNDPC FAN SPRY TIP SCT (DISPOSABLE) ×1 IMPLANT
SUT MNCRL AB 4-0 PS2 18 (SUTURE) ×1 IMPLANT
SUT VIC AB 2-0 CT1 TAPERPNT 27 (SUTURE) ×3 IMPLANT
SUTURE STRATFX 0 PDS 27 VIOLET (SUTURE) ×1 IMPLANT
TOWEL GREEN STERILE FF (TOWEL DISPOSABLE) ×1 IMPLANT
TRAY FOLEY MTR SLVR 16FR STAT (SET/KITS/TRAYS/PACK) ×1 IMPLANT
TRAY FOLEY SLVR 14FR TEMP STAT (SET/KITS/TRAYS/PACK) IMPLANT
TUBE SUCTION HIGH CAP CLEAR NV (SUCTIONS) ×1 IMPLANT
WATER STERILE IRR 1000ML POUR (IV SOLUTION) ×2 IMPLANT
WRAP KNEE MAXI GEL POST OP (GAUZE/BANDAGES/DRESSINGS) ×1 IMPLANT

## 2024-08-21 NOTE — Discharge Instructions (Addendum)
Robin Arabian, MD Total Joint Specialist [facility] [address], [address] [place], [place] [address] [phone]  TOTAL KNEE REPLACEMENT POSTOPERATIVE DIRECTIONS    Knee Rehabilitation, Guidelines Following Surgery  Results after knee surgery are often greatly improved when you follow the exercise, range of motion and muscle strengthening exercises prescribed by your doctor. Safety measures are also important to protect the knee from further injury. If any of these exercises cause you to have increased pain or swelling in your knee joint, decrease the amount until you are comfortable again and slowly increase them. If you have problems or questions, call your caregiver or physical therapist for advice.   BLOOD CLOT PREVENTION Take an 81 mg Aspirin two times a day for three weeks following surgery. Then take an 81 mg Aspirin once a day for three weeks. Then discontinue Aspirin. You may resume your vitamins/supplements upon discharge from the hospital. Do not take any NSAIDs (Advil, Aleve, Ibuprofen, Meloxicam, etc.) until you are 3 weeks out from surgery.  HOME CARE INSTRUCTIONS  Remove items at home which could result in a fall. This includes throw rugs or furniture in walking pathways.  ICE to the affected knee as much as tolerated. Icing helps control swelling. If the swelling is well controlled you will be more comfortable and rehab easier. Continue to use ice on the knee for pain and swelling from surgery. You may notice swelling that will progress down to the foot and ankle. This is normal after surgery. Elevate the leg when you are not up walking on it.    Continue to use the breathing machine which will help keep your temperature down. It is common for your temperature to cycle up and down following surgery, especially at night when you are not up moving around and exerting yourself. The breathing machine keeps your lungs expanded and your temperature down. Do  not place pillow under the operative knee, focus on keeping the knee straight while resting  DIET You may resume your previous home diet once you are discharged from the hospital.  DRESSING / WOUND CARE / SHOWERING Keep your bulky bandage on for 2 days. On the third post-operative day you may remove the Ace bandage and gauze. There is a waterproof adhesive bandage on your skin which will stay in place until your first follow-up appointment. Once you remove this you will not need to place another bandage You may begin showering 3 days following surgery, but do not submerge the incision under water.  ACTIVITY For the first 5 days, the key is rest and control of pain and swelling Do your home exercises twice a day starting on post-operative day 3. On the days you go to physical therapy, just do the home exercises once that day. You should rest, ice and elevate the leg for 50 minutes out of every hour. Get up and walk/stretch for 10 minutes per hour. After 5 days you can increase your activity slowly as tolerated. Walk with your walker as instructed. Use the walker until you are comfortable transitioning to a cane. Walk with the cane in the opposite hand of the operative leg. You may discontinue the cane once you are comfortable and walking steadily. Avoid periods of inactivity such as sitting longer than an hour when not asleep. This helps prevent blood clots.  You may discontinue the knee immobilizer once you are able to perform a straight leg raise while lying down. You may resume a sexual relationship in one month  or when given the OK by your doctor.  You may return to work once you are cleared by your doctor.  Do not drive a car for 6 weeks or until released by your surgeon.  Do not drive while taking narcotics.  TED HOSE STOCKINGS Wear the elastic stockings on both legs for three weeks following surgery during the day. You may remove them at night for sleeping.  WEIGHT BEARING Weight  bearing as tolerated with assist device (walker, cane, etc) as directed, use it as long as suggested by your surgeon or therapist, typically at least 4-6 weeks.  POSTOPERATIVE CONSTIPATION PROTOCOL Constipation - defined medically as fewer than three stools per week and severe constipation as less than one stool per week.  One of the most common issues patients have following surgery is constipation.  Even if you have a regular bowel pattern at home, your normal regimen is likely to be disrupted due to multiple reasons following surgery.  Combination of anesthesia, postoperative narcotics, change in appetite and fluid intake all can affect your bowels.  In order to avoid complications following surgery, here are some recommendations in order to help you during your recovery period.  Colace (docusate) - Pick up an over-the-counter form of Colace or another stool softener and take twice a day as long as you are requiring postoperative pain medications.  Take with a full glass of water daily.  If you experience loose stools or diarrhea, hold the colace until you stool forms back up. If your symptoms do not get better within 1 week or if they get worse, check with your doctor. Dulcolax (bisacodyl) - Pick up over-the-counter and take as directed by the product packaging as needed to assist with the movement of your bowels.  Take with a full glass of water.  Use this product as needed if not relieved by Colace only.  MiraLax (polyethylene glycol) - Pick up over-the-counter to have on hand. MiraLax is a solution that will increase the amount of water in your bowels to assist with bowel movements.  Take as directed and can mix with a glass of water, juice, soda, coffee, or tea. Take if you go more than two days without a movement. Do not use MiraLax more than once per day. Call your doctor if you are still constipated or irregular after using this medication for 7 days in a row.  If you continue to have problems  with postoperative constipation, please contact the office for further assistance and recommendations.  If you experience "the worst abdominal pain ever" or develop nausea or vomiting, please contact the office immediatly for further recommendations for treatment.  ITCHING If you experience itching with your medications, try taking only a single pain pill, or even half a pain pill at a time.  You can also use Benadryl over the counter for itching or also to help with sleep.   MEDICATIONS See your medication summary on the "After Visit Summary" that the nursing staff will review with you prior to discharge.  You may have some home medications which will be placed on hold until you complete the course of blood thinner medication.  It is important for you to complete the blood thinner medication as prescribed by your surgeon.  Continue your approved medications as instructed at time of discharge.  PRECAUTIONS If you experience chest pain or shortness of breath - call 911 immediately for transfer to the hospital emergency department.  If you develop a fever greater that 101 F,  purulent drainage from wound, increased redness or drainage from wound, foul odor from the wound/dressing, or calf pain - CONTACT YOUR SURGEON.                                                   FOLLOW-UP APPOINTMENTS Make sure you keep all of your appointments after your operation with your surgeon and caregivers. You should call the office at the above phone number and make an appointment for approximately two weeks after the date of your surgery or on the date instructed by your surgeon outlined in the "After Visit Summary".  RANGE OF MOTION AND STRENGTHENING EXERCISES  Rehabilitation of the knee is important following a knee injury or an operation. After just a few days of immobilization, the muscles of the thigh which control the knee become weakened and shrink (atrophy). Knee exercises are designed to build up the tone and  strength of the thigh muscles and to improve knee motion. Often times heat used for twenty to thirty minutes before working out will loosen up your tissues and help with improving the range of motion but do not use heat for the first two weeks following surgery. These exercises can be done on a training (exercise) mat, on the floor, on a table or on a bed. Use what ever works the best and is most comfortable for you Knee exercises include:  Leg Lifts - While your knee is still immobilized in a splint or cast, you can do straight leg raises. Lift the leg to 60 degrees, hold for 3 sec, and slowly lower the leg. Repeat 10-20 times 2-3 times daily. Perform this exercise against resistance later as your knee gets better.  Quad and Hamstring Sets - Tighten up the muscle on the front of the thigh (Quad) and hold for 5-10 sec. Repeat this 10-20 times hourly. Hamstring sets are done by pushing the foot backward against an object and holding for 5-10 sec. Repeat as with quad sets.  Leg Slides: Lying on your back, slowly slide your foot toward your buttocks, bending your knee up off the floor (only go as far as is comfortable). Then slowly slide your foot back down until your leg is flat on the floor again. Angel Wings: Lying on your back spread your legs to the side as far apart as you can without causing discomfort.  A rehabilitation program following serious knee injuries can speed recovery and prevent re-injury in the future due to weakened muscles. Contact your doctor or a physical therapist for more information on knee rehabilitation.   POST-OPERATIVE OPIOID TAPER INSTRUCTIONS: It is important to wean off of your opioid medication as soon as possible. If you do not need pain medication after your surgery it is ok to stop day one. Opioids include: Codeine, Hydrocodone(Norco, Vicodin), Oxycodone(Percocet, oxycontin) and hydromorphone amongst others.  Long term and even short term use of opiods can  cause: Increased pain response Dependence Constipation Depression Respiratory depression And more.  Withdrawal symptoms can include Flu like symptoms Nausea, vomiting And more Techniques to manage these symptoms Hydrate well Eat regular healthy meals Stay active Use relaxation techniques(deep breathing, meditating, yoga) Do Not substitute Alcohol to help with tapering If you have been on opioids for less than two weeks and do not have pain than it is ok to stop all together.  Plan  to wean off of opioids This plan should start within one week post op of your joint replacement. Maintain the same interval or time between taking each dose and first decrease the dose.  Cut the total daily intake of opioids by one tablet each day Next start to increase the time between doses. The last dose that should be eliminated is the evening dose.   IF YOU ARE TRANSFERRED TO A SKILLED REHAB FACILITY If the patient is transferred to a skilled rehab facility following release from the hospital, a list of the current medications will be sent to the facility for the patient to continue.  When discharged from the skilled rehab facility, please have the facility set up the patient's Home Health Physical Therapy prior to being released. Also, the skilled facility will be responsible for providing the patient with their medications at time of release from the facility to include their pain medication, the muscle relaxants, and their blood thinner medication. If the patient is still at the rehab facility at time of the two week follow up appointment, the skilled rehab facility will also need to assist the patient in arranging follow up appointment in our office and any transportation needs.  MAKE SURE YOU:  Understand these instructions.  Get help right away if you are not doing well or get worse.   DENTAL ANTIBIOTICS:  In most cases prophylactic antibiotics for Dental procdeures after total joint surgery are  not necessary.  Exceptions are as follows:  1. History of prior total joint infection  2. Severely immunocompromised (Organ Transplant, cancer chemotherapy, Rheumatoid biologic meds such as Humera)  3. Poorly controlled diabetes (A1C &gt; 8.0, blood glucose over 200)  If you have one of these conditions, contact your surgeon for an antibiotic prescription, prior to your dental procedure.    Pick up stool softner and laxative for home use following surgery while on pain medications. Do not submerge incision under water. Please use good hand washing techniques while changing dressing each day. May shower starting three days after surgery. Please use a clean towel to pat the incision dry following showers. Continue to use ice for pain and swelling after surgery. Do not use any lotions or creams on the incision until instructed by your surgeon.  

## 2024-08-21 NOTE — Evaluation (Signed)
 Physical Therapy Evaluation Patient Details Name: Robin King MRN: [id number] DOB: Mar 31, 1966 Today's Date: 08/21/2024  History of Present Illness  58 yo female presents to therapy s/p L TKA on 08/21/2024 due to failure of conservative measures. Pt PMH includes but not limited to: lumbar DDD, HTN, HLD, GERD, GAD, pulmonary emphysema, peripheral neuropathy, R carpel tunnel release and R TKA on 02/04/2024 .  Clinical Impression      DARIYA GAINER is a 58 y.o. female POD 0 s/p L TKA. Patient reports IND with mobility at baseline. Patient is now limited by functional impairments (see PT problem list below) and required S for bed mobility and CGA for transfers and gait with RW and L KI donned. Patient was able to ambulate 50 and 45 feet with RW and R KI donned with  cues for safe walker management. Patient educated on safe sequencing for stair mobility with use of gait belt, fall risk prevention, KI wear schedule and recommendations, slowly increasing activity, pain management and goal, use of CP/ice and car transfers pt and friend verbalized understanding of safe guarding position for people assisting with mobility. Patient instructed in exercises to facilitate ROM and circulation reviewed and HO provided. R LE instability noted with LE TE and more pronounced with gait trial, attributed to slow regression of anesthesia impacting R LE motor control and coordination. Patient will benefit from continued skilled PT interventions to address impairments and progress towards PLOF. Patient has met mobility goals at adequate level for discharge home with family and social support with OPPT services scheduled for 9/17; will continue to follow if pt continues acute stay to progress towards Mod I goals.     If plan is discharge home, recommend the following: A little help with walking and/or transfers;A little help with bathing/dressing/bathroom;Assistance with cooking/housework;Assist for transportation;Help with  stairs or ramp for entrance   Can travel by private vehicle        Equipment Recommendations None recommended by PT  Recommendations for Other Services       Functional Status Assessment Patient has had a recent decline in their functional status and demonstrates the ability to make significant improvements in function in a reasonable and predictable amount of time.     Precautions / Restrictions Precautions Precautions: Fall;Knee Restrictions Weight Bearing Restrictions Per Provider Order: No      Mobility  Bed Mobility Overal bed mobility: Needs Assistance Bed Mobility: Supine to Sit     Supine to sit: Supervision     General bed mobility comments: min cues    Transfers Overall transfer level: Needs assistance Equipment used: Rolling walker (2 wheels) Transfers: Sit to/from Stand Sit to Stand: Contact guard assist           General transfer comment: min cues for bed, recliner and commode transfers    Ambulation/Gait Ambulation/Gait assistance: Contact guard assist Gait Distance (Feet): 50 Feet Assistive device: Rolling walker (2 wheels) Gait Pattern/deviations: Step-to pattern, Antalgic, Knees buckling, Knee flexed in stance - left, Decreased stance time - left, Trunk flexed Gait velocity: decreased     General Gait Details: heavy reliance on B UE support at RW to offoload L LE in stance phase with noted L knee instability with L KI donned, pt able to progress to no instabiltiy with KI donned cues for co-contraction when in L stace phase  Stairs Stairs: Yes Stairs assistance: Contact guard assist, Min assist Stair Management: Two rails Number of Stairs: 2 General stair comments: min A for first  step when pt in L LE WB with KI donned, cues for safety sequencing and step to technique with min cues and pt able to progress to CGA  Wheelchair Mobility     Tilt Bed    Modified Rankin (Stroke Patients Only)       Balance Overall balance assessment:  Needs assistance Sitting-balance support: Feet supported Sitting balance-Leahy Scale: Good     Standing balance support: Bilateral upper extremity supported, During functional activity, Reliant on assistive device for balance Standing balance-Leahy Scale: Fair Standing balance comment: static standing no UE support with pt self limiting L LE WB                             Pertinent Vitals/Pain Pain Assessment Pain Assessment: 0-10 Pain Score: 2  Pain Location: L knee and LE Pain Descriptors / Indicators: Aching, Constant, Discomfort, Dull, Grimacing, Operative site guarding Pain Intervention(s): Limited activity within patient's tolerance, Monitored during session, Premedicated before session, Repositioned, Ice applied    Home Living Family/patient expects to be discharged to:: Private residence Living Arrangements: Children Available Help at Discharge: Family;Friend(s) Type of Home: House Home Access: Stairs to enter Entrance Stairs-Rails: None (can reach the supports on deck) Entrance Stairs-Number of Steps: 2   Home Layout: One level Home Equipment: Agricultural consultant (2 wheels);Cane - single point      Prior Function Prior Level of Function : Independent/Modified Independent;Driving             Mobility Comments: IND no AD for all ADLs, self care tasks and IADLs       Extremity/Trunk Assessment        Lower Extremity Assessment Lower Extremity Assessment: LLE deficits/detail LLE Deficits / Details: ankle DF/PF 5/5; SLR > 10 degree lag LLE Sensation: WNL    Cervical / Trunk Assessment Cervical / Trunk Assessment: Normal  Communication   Communication Communication: No apparent difficulties    Cognition Arousal: Alert Behavior During Therapy: WFL for tasks assessed/performed   PT - Cognitive impairments: No apparent impairments                         Following commands: Intact       Cueing       General Comments       Exercises Total Joint Exercises Ankle Circles/Pumps: AROM, Both, 10 reps Quad Sets: AROM, Left, 5 reps Short Arc Quad: Left, 5 reps, AAROM Heel Slides: AROM, Left, 5 reps Hip ABduction/ADduction: AROM, Left, 5 reps Straight Leg Raises: Left, 5 reps, AAROM   Assessment/Plan    PT Assessment Patient needs continued PT services  PT Problem List Decreased strength;Decreased range of motion;Decreased activity tolerance;Decreased balance;Decreased coordination;Decreased mobility;Pain       PT Treatment Interventions DME instruction;Gait training;Stair training;Functional mobility training;Therapeutic activities;Therapeutic exercise;Balance training;Neuromuscular re-education;Patient/family education;Modalities    PT Goals (Current goals can be found in the Care Plan section)  Acute Rehab PT Goals Patient Stated Goal: to be able to get down on the floor and back up without pain PT Goal Formulation: With patient Time For Goal Achievement: 09/04/24 Potential to Achieve Goals: Good    Frequency 7X/week     Co-evaluation               AM-PAC PT 6 Clicks Mobility  Outcome Measure Help needed turning from your back to your side while in a flat bed without using bedrails?: None Help needed moving from  lying on your back to sitting on the side of a flat bed without using bedrails?: A Little Help needed moving to and from a bed to a chair (including a wheelchair)?: A Little Help needed standing up from a chair using your arms (e.g., wheelchair or bedside chair)?: A Little Help needed to walk in hospital room?: A Little Help needed climbing 3-5 steps with a railing? : A Little 6 Click Score: 19    End of Session Equipment Utilized During Treatment: Gait belt;Left knee immobilizer (L KI) Activity Tolerance: Patient tolerated treatment well;No increased pain Patient left: in chair;with call bell/phone within reach;with family/visitor present Nurse Communication: Mobility  status;Other (comment) (pt readiness for same day d/c from PT stand point) PT Visit Diagnosis: Other abnormalities of gait and mobility (R26.89);Unsteadiness on feet (R26.81);Muscle weakness (generalized) (M62.81);Difficulty in walking, not elsewhere classified (R26.2);Pain Pain - Right/Left: Left Pain - part of body: Knee;Leg    Time: 8862-8781 PT Time Calculation (min) (ACUTE ONLY): 41 min   Charges:   PT Evaluation $PT Eval Low Complexity: 1 Low PT Treatments $Gait Training: 8-22 mins $Therapeutic Exercise: 8-22 mins PT General Charges $$ ACUTE PT VISIT: 1 Visit         [place], PT Acute Rehab   [place] VEAR Drone 08/21/2024, 1:25 PM

## 2024-08-21 NOTE — Anesthesia Postprocedure Evaluation (Signed)
 Anesthesia Post Note  Patient: Robin King  Procedure(s) Performed: ARTHROPLASTY, KNEE, TOTAL (Left: Knee)     Patient location during evaluation: PACU Anesthesia Type: Spinal Level of consciousness: sedated and patient cooperative Pain management: pain level controlled Vital Signs Assessment: post-procedure vital signs reviewed and stable Respiratory status: spontaneous breathing Cardiovascular status: stable Anesthetic complications: no   No notable events documented.  Last Vitals:  Vitals:   08/21/24 1003 08/21/24 1100  BP: 118/76 119/61  Pulse: 84 86  Resp: 20 14  Temp: 36.6 C   SpO2: 100% 98%    Last Pain:  Vitals:   08/21/24 1215  TempSrc:   PainSc: 2                  [name]

## 2024-08-21 NOTE — Transfer of Care (Signed)
 Immediate Anesthesia Transfer of Care Note  Patient: Robin King  Procedure(s) Performed: ARTHROPLASTY, KNEE, TOTAL (Left: Knee)  Patient Location: PACU  Anesthesia Type:MAC, Regional, and Spinal  Level of Consciousness: awake, alert , and oriented  Airway & Oxygen Therapy: Patient Spontanous Breathing and Patient connected to nasal cannula oxygen  Post-op Assessment: Report given to RN and Post -op Vital signs reviewed and stable  Post vital signs: Reviewed and stable  Last Vitals:  Vitals Value Taken Time  BP 134/74 08/21/24 08:52  Temp    Pulse 80 08/21/24 08:54  Resp 13 08/21/24 08:54  SpO2 100 % 08/21/24 08:54  Vitals shown include unfiled device data.  Last Pain:  Vitals:   08/21/24 0626  TempSrc:   PainSc: 0-No pain         Complications: No notable events documented.

## 2024-08-21 NOTE — Op Note (Signed)
 OPERATIVE REPORT-TOTAL KNEE ARTHROPLASTY   Pre-operative diagnosis- Osteoarthritis  Left knee(s)  Post-operative diagnosis- Osteoarthritis Left knee(s)  Procedure-  Left  Total Knee Arthroplasty  Surgeon- Dempsey GAILS. Soumya Colson, MD  Assistant- Corean Sender, PA-C   Anesthesia-  Adductor canal block and spinal  EBL-50 mL   Drains None  Tourniquet time-  Total Tourniquet Time Documented: Thigh (Left) - 37 minutes Total: Thigh (Left) - 37 minutes     Complications- None  Condition-PACU - hemodynamically stable.   Brief Clinical Note  Robin King is a 58 y.o. year old female with end stage OA of her left knee with progressively worsening pain and dysfunction. She has constant pain, with activity and at rest and significant functional deficits with difficulties even with ADLs. She has had extensive non-op management including analgesics, injections of cortisone and viscosupplements, and home exercise program, but remains in significant pain with significant dysfunction. Radiographs show bone on bone arthritis lateral with valgus deformity. She presents now for left Total Knee Arthroplasty.     Procedure in detail---   The patient is brought into the operating room and positioned supine on the operating table. After successful administration of  adductor canal block and spinal,   a tourniquet is placed high on the  Left thigh(s) and the lower extremity is prepped and draped in the usual sterile fashion. Time out is performed by the operating team and then the  Left lower extremity is wrapped in Esmarch, knee flexed and the tourniquet inflated to 300 mmHg.       A midline incision is made with a ten blade through the subcutaneous tissue to the level of the extensor mechanism. A fresh blade is used to make a medial parapatellar arthrotomy. Soft tissue over the proximal medial tibia is subperiosteally elevated to the joint line with a knife and into the semimembranosus bursa with a  Cobb elevator. Soft tissue over the proximal lateral tibia is elevated with attention being paid to avoiding the patellar tendon on the tibial tubercle. The patella is everted, knee flexed 90 degrees and the ACL and PCL are removed. Findings are bone on bone lateral compartment        The drill is used to create a starting hole in the distal femur and the canal is thoroughly irrigated with sterile saline to remove the fatty contents. The 5 degree Left  valgus alignment guide is placed into the femoral canal and the distal femoral cutting block is pinned to remove 10 mm off the distal femur. Resection is made with an oscillating saw.      The tibia is subluxed forward and the menisci are removed. The extramedullary alignment guide is placed referencing proximally at the medial aspect of the tibial tubercle and distally along the second metatarsal axis and tibial crest. The block is pinned to remove 2mm off the more deficient lateral  side. Resection is made with an oscillating saw. Size 5is the most appropriate size for the tibia and the proximal tibia is prepared with the modular drill and keel punch for that size.      The femoral sizing guide is placed and size 6 is most appropriate. Rotation is marked off the epicondylar axis and confirmed by creating a rectangular flexion gap at 90 degrees. The size 6 cutting block is pinned in this rotation and the anterior, posterior and chamfer cuts are made with the oscillating saw. The intercondylar block is then placed and that cut is made.  Trial size 5 tibial component, trial size 6 narrow posterior stabilized femur and a 10  mm posterior stabilized rotating platform insert trial is placed. Full extension is achieved with excellent varus/valgus and anterior/posterior balance throughout full range of motion. The patella is everted and thickness measured to be 21  mm. Free hand resection is taken to 12 mm, a 35 template is placed, lug holes are drilled, trial  patella is placed, and it tracks normally. Osteophytes are removed off the posterior femur with the trial in place. All trials are removed and the cut bone surfaces prepared with pulsatile lavage. Cement is mixed and once ready for implantation, the size 5 tibial implant, size  6 narrow posterior stabilized femoral component, and the size 35 patella are cemented in place and the patella is held with the clamp. The trial insert is placed and the knee held in full extension. The Exparel  (20 ml mixed with 60 ml saline) is injected into the extensor mechanism, posterior capsule, medial and lateral gutters and subcutaneous tissues.  All extruded cement is removed and once the cement is hard the permanent 10 mm posterior stabilized rotating platform insert is placed into the tibial tray.      The wound is copiously irrigated with saline solution and the extensor mechanism closed with # 0 Stratofix suture. The tourniquet is released for a total tourniquet time of 37  minutes. Flexion against gravity is 140 degrees and the patella tracks normally. Subcutaneous tissue is closed with 2.0 vicryl and subcuticular with running 4.0 Monocryl. The incision is cleaned and dried and steri-strips and a bulky sterile dressing are applied. The limb is placed into a knee immobilizer and the patient is awakened and transported to recovery in stable condition.      Please note that a surgical assistant was a medical necessity for this procedure in order to perform it in a safe and expeditious manner. Surgical assistant was necessary to retract the ligaments and vital neurovascular structures to prevent injury to them and also necessary for proper positioning of the limb to allow for anatomic placement of the prosthesis.   [name], MD    08/21/2024, 8:32 AM

## 2024-08-21 NOTE — Anesthesia Procedure Notes (Signed)
 Procedure Name: MAC Date/Time: 08/21/2024 7:15 AM  Performed by: Obadiah Reyes BROCKS, CRNAPre-anesthesia Checklist: Patient identified, Emergency Drugs available, Suction available, Patient being monitored and Timeout performed Patient Re-evaluated:Patient Re-evaluated prior to induction Oxygen Delivery Method: Simple face mask Induction Type: IV induction

## 2024-08-21 NOTE — Anesthesia Procedure Notes (Signed)
 Anesthesia Regional Block: Adductor canal block   Pre-Anesthetic Checklist: , timeout performed,  Correct Patient, Correct Site, Correct Laterality,  Correct Procedure, Correct Position, site marked,  Risks and benefits discussed,  Surgical consent,  Pre-op evaluation,  At surgeon's request and post-op pain management  Laterality: Lower and Left  Prep: chloraprep       Needles:  Injection technique: Single-shot  Needle Type: Stimiplex     Needle Length: 9cm  Needle Gauge: 21     Additional Needles:   Procedures:,,,, ultrasound used (permanent image in chart),,    Narrative:  Start time: 08/21/2024 6:49 AM End time: 08/21/2024 7:09 AM Injection made incrementally with aspirations every 5 mL.  Performed by: Personally  Anesthesiologist: Darlyn Rush, MD  Additional Notes: BP cuff, EKG monitors applied. Sedation begun. Artery and nerve location verified with ultrasound. Anesthetic injected incrementally (5ml), slowly, and after negative aspirations under direct u/s guidance. Good fascial/perineural spread. Tolerated well.

## 2024-08-21 NOTE — Interval H&P Note (Signed)
 History and Physical Interval Note:  08/21/2024 6:27 AM  Robin King  has presented today for surgery, with the diagnosis of Left knee osteoarthritis.  The various methods of treatment have been discussed with the patient and family. After consideration of risks, benefits and other options for treatment, the patient has consented to  Procedure(s): ARTHROPLASTY, KNEE, TOTAL (Left) as a surgical intervention.  The patient's history has been reviewed, patient examined, no change in status, stable for surgery.  I have reviewed the patient's chart and labs.  Questions were answered to the patient's satisfaction.     Dempsey Chioma Mukherjee

## 2024-08-21 NOTE — Anesthesia Procedure Notes (Signed)
 Spinal  Patient location during procedure: OR Start time: 08/21/2024 7:20 AM End time: 08/21/2024 7:30 AM Reason for block: surgical anesthesia Staffing Performed: anesthesiologist  Anesthesiologist: Darlyn Rush, MD Performed by: Darlyn Rush, MD Authorized by: Darlyn Rush, MD   Preanesthetic Checklist Completed: patient identified, IV checked, site marked, risks and benefits discussed, surgical consent, monitors and equipment checked, pre-op evaluation and timeout performed Spinal Block Patient position: sitting Prep: DuraPrep and site prepped and draped Patient monitoring: heart rate, continuous pulse ox and blood pressure Approach: midline Location: L3-4 Injection technique: single-shot Needle Needle type: Spinocan  Needle gauge: 25 G Needle length: 9 cm Additional Notes Expiration date of kit checked and confirmed. Patient tolerated procedure well, without complications.

## 2024-08-22 ENCOUNTER — Encounter (HOSPITAL_COMMUNITY): Payer: Self-pay | Admitting: Orthopedic Surgery

## 2024-09-07 ENCOUNTER — Encounter: Payer: Self-pay | Admitting: Family

## 2024-09-07 DIAGNOSIS — R41840 Attention and concentration deficit: Secondary | ICD-10-CM

## 2024-09-08 MED ORDER — AMPHETAMINE-DEXTROAMPHETAMINE 20 MG PO TABS: 20.0000 mg | ORAL_TABLET | Freq: Two times a day (BID) | ORAL | 0 refills | Status: DC

## 2024-10-19 ENCOUNTER — Other Ambulatory Visit: Payer: Self-pay | Admitting: Family

## 2024-10-19 ENCOUNTER — Encounter: Payer: Self-pay | Admitting: Family

## 2024-10-19 DIAGNOSIS — R41840 Attention and concentration deficit: Secondary | ICD-10-CM

## 2024-10-26 ENCOUNTER — Telehealth (INDEPENDENT_AMBULATORY_CARE_PROVIDER_SITE_OTHER): Admitting: Family

## 2024-10-26 DIAGNOSIS — F3340 Major depressive disorder, recurrent, in remission, unspecified: Secondary | ICD-10-CM | POA: Diagnosis not present

## 2024-10-26 DIAGNOSIS — F900 Attention-deficit hyperactivity disorder, predominantly inattentive type: Secondary | ICD-10-CM

## 2024-10-26 DIAGNOSIS — R41840 Attention and concentration deficit: Secondary | ICD-10-CM | POA: Insufficient documentation

## 2024-10-26 MED ORDER — SERTRALINE HCL 100 MG PO TABS: 100.0000 mg | ORAL_TABLET | Freq: Every day | ORAL | 3 refills | Status: AC

## 2024-10-26 MED ORDER — AMPHETAMINE-DEXTROAMPHETAMINE 20 MG PO TABS: 20.0000 mg | ORAL_TABLET | Freq: Two times a day (BID) | ORAL | 0 refills | Status: AC

## 2024-10-26 NOTE — Assessment & Plan Note (Signed)
 Much improved with sertraline  Continue sertraline  100 mg once daily

## 2024-10-26 NOTE — Progress Notes (Signed)
 Established Patient Office Visit  Subjective:      CC:  Chief Complaint  Patient presents with   Medical Management of Chronic Issues    HPI: Robin King is a 58 y.o. female presenting on 10/26/2024 for Medical Management of Chronic Issues .  Discussed the use of AI scribe software for clinical note transcription with the patient, who gave verbal consent to proceed.  History of Present Illness  Recently had knee surgery and did well.  Walking pain free and this is a 'blessing' has had two f/u appts for post op with orthopedist and has been released.   MDD and GAD started sertraline  100 mg once daily, still taking bupropion 300 mg once daily. Does use alprazolam  but 'not much at all'   ADD: taking adderall 20 mg twice daily. No palpitations chest pain sob. 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 Does find she has to take BID to stay focused.       Social history:  Relevant past medical, surgical, family and social history reviewed and updated as indicated. Interim medical history since our last visit reviewed.  Allergies and medications reviewed and updated.  DATA REVIEWED: CHART IN EPIC     ROS: Negative unless specifically indicated above in HPI.    Current Outpatient Medications:    ALPRAZolam  (XANAX ) 1 MG tablet, Take 1-2 tablets once daily prn anxiety, Disp: 20 tablet, Rfl: 1   atorvastatin  (LIPITOR) 20 MG tablet, Take 1 tablet (20 mg total) by mouth every evening., Disp: 90 tablet, Rfl: 3   buPROPion (WELLBUTRIN XL) 300 MG 24 hr tablet, Take 300 mg by mouth in the morning., Disp: , Rfl:    Cholecalciferol (VITAMIN D-3 PO), Take 2,000 Units by mouth daily in the afternoon., Disp: , Rfl:    Cyanocobalamin (VITAMIN B-12 PO), Take 1,000 mcg by mouth daily in the afternoon., Disp: , Rfl:    gabapentin (NEURONTIN) 300 MG  capsule, Take 300-600 mg by mouth See admin instructions. Take 1 capsule (300 mg) by mouth in the morning, take 1 capsule (300 mg) by mouth in the afternoon & take 2 capsules (600 mg) by mouth at night., Disp: , Rfl:    L-LYSINE PO, Take 1,000 mg by mouth in the morning., Disp: , Rfl:    methocarbamol  (ROBAXIN ) 500 MG tablet, Take 1 tablet (500 mg total) by mouth every 6 (six) hours as needed for muscle spasms., Disp: 40 tablet, Rfl: 0   methocarbamol  (ROBAXIN ) 750 MG tablet, Take 750-1,500 mg by mouth See admin instructions. Take 1 tablet (750 mg) by mouth in the morning, take 1 tablet (750 mg) by mouth in the afternoon & take 2 tablets (1500 mg) by mouth at night., Disp: , Rfl:    omeprazole (PRILOSEC) 40 MG capsule, Take 40 mg by mouth daily before breakfast., Disp: , Rfl:    oxyCODONE  (ROXICODONE ) 5 MG immediate release tablet, Take 1-2 tablets (5-10 mg total) by mouth every 6 (six) hours as needed for severe pain (pain score 7-10)., Disp: 42 tablet, Rfl: 0   TURMERIC PO, Take 1,500 mg by mouth in the morning, at noon, and at bedtime., Disp: , Rfl:    valACYclovir (VALTREX) 500 MG tablet, Take 500 mg by mouth in the morning., Disp: , Rfl:    amphetamine -dextroamphetamine  (ADDERALL) 20 MG tablet, Take 1 tablet (20 mg total) by  mouth 2 (two) times daily., Disp: 60 tablet, Rfl: 0   [START ON 11/16/2024] amphetamine -dextroamphetamine  (ADDERALL) 20 MG tablet, Take 1 tablet (20 mg total) by mouth 2 (two) times daily., Disp: 60 tablet, Rfl: 0   [START ON 12/07/2024] amphetamine -dextroamphetamine  (ADDERALL) 20 MG tablet, Take 1 tablet (20 mg total) by mouth 2 (two) times daily., Disp: 60 tablet, Rfl: 0   sertraline  (ZOLOFT ) 100 MG tablet, Take 1 tablet (100 mg total) by mouth at bedtime., Disp: 90 tablet, Rfl: 3        Objective:        There were no vitals taken for this visit.  Physical Exam   Wt Readings from Last 3 Encounters:  08/21/24 215 lb (97.5 kg)  08/14/24 215 lb (97.5 kg)   07/19/24 215 lb 7.2 oz (97.7 kg)    Physical Exam Vitals reviewed.  Constitutional:      General: She is not in acute distress.    Appearance: Normal appearance. She is not ill-appearing.  Pulmonary:     Effort: Pulmonary effort is normal.  Neurological:     General: No focal deficit present.     Mental Status: She is alert and oriented to person, place, and time.  Psychiatric:        Mood and Affect: Mood normal.        Behavior: Behavior normal.        Thought Content: Thought content normal.          Results   Assessment & Plan:   Assessment and Plan Assessment & Plan   MDD (recurrent major depressive disorder) in remission Assessment & Plan: Much improved with sertraline  Continue sertraline  100 mg once daily    Orders: -     Sertraline  HCl; Take 1 tablet (100 mg total) by mouth at bedtime.  Dispense: 90 tablet; Refill: 3  Attention or concentration deficit -     Amphetamine -Dextroamphetamine ; Take 1 tablet (20 mg total) by mouth 2 (two) times daily.  Dispense: 60 tablet; Refill: 0 -     Amphetamine -Dextroamphetamine ; Take 1 tablet (20 mg total) by mouth 2 (two) times daily.  Dispense: 60 tablet; Refill: 0 -     Amphetamine -Dextroamphetamine ; Take 1 tablet (20 mg total) by mouth 2 (two) times daily.  Dispense: 60 tablet; Refill: 0  Attention deficit hyperactivity disorder, predominantly inattentive type Assessment & Plan: Uds and non opioid contract utd  Pt tolerating adderall well  Continue adderall 20 mg bid prn  Pdmp reviewed.          Return in about 3 months (around 01/26/2025) for f/u ADD medication.     Ginger Patrick, MSN, APRN, FNP-C [place] [facility]

## 2024-10-26 NOTE — Assessment & Plan Note (Signed)
 Uds and non opioid contract utd  Pt tolerating adderall well  Continue adderall 20 mg bid prn  Pdmp reviewed.

## 2024-10-31 ENCOUNTER — Encounter: Payer: Self-pay | Admitting: Family

## 2024-11-11 ENCOUNTER — Other Ambulatory Visit: Payer: Self-pay | Admitting: Cardiovascular Disease

## 2024-11-11 ENCOUNTER — Encounter: Payer: Self-pay | Admitting: Cardiovascular Disease
